# Patient Record
Sex: Female | Born: 1955 | Race: White | Hispanic: No | Smoking: Former smoker
Health system: Southern US, Community
[De-identification: ages and names within clinical notes are randomized; demographics above are authoritative.]

## PROBLEM LIST (undated history)

## (undated) DIAGNOSIS — K2210 Ulcer of esophagus without bleeding: Secondary | ICD-10-CM

## (undated) DIAGNOSIS — M1990 Unspecified osteoarthritis, unspecified site: Secondary | ICD-10-CM

## (undated) DIAGNOSIS — E785 Hyperlipidemia, unspecified: Secondary | ICD-10-CM

## (undated) DIAGNOSIS — J189 Pneumonia, unspecified organism: Secondary | ICD-10-CM

## (undated) DIAGNOSIS — E119 Type 2 diabetes mellitus without complications: Secondary | ICD-10-CM

## (undated) DIAGNOSIS — E1169 Type 2 diabetes mellitus with other specified complication: Secondary | ICD-10-CM

## (undated) DIAGNOSIS — I10 Essential (primary) hypertension: Secondary | ICD-10-CM

## (undated) DIAGNOSIS — F329 Major depressive disorder, single episode, unspecified: Secondary | ICD-10-CM

## (undated) DIAGNOSIS — E669 Obesity, unspecified: Secondary | ICD-10-CM

## (undated) DIAGNOSIS — K5710 Diverticulosis of small intestine without perforation or abscess without bleeding: Secondary | ICD-10-CM

## (undated) DIAGNOSIS — L309 Dermatitis, unspecified: Secondary | ICD-10-CM

## (undated) DIAGNOSIS — G894 Chronic pain syndrome: Secondary | ICD-10-CM

## (undated) DIAGNOSIS — Z8709 Personal history of other diseases of the respiratory system: Secondary | ICD-10-CM

## (undated) HISTORY — PX: COLONOSCOPY: SHX174

## (undated) HISTORY — DX: Major depressive disorder, single episode, unspecified: F32.9

## (undated) HISTORY — DX: Unspecified osteoarthritis, unspecified site: M19.90

## (undated) HISTORY — DX: Type 2 diabetes mellitus without complications: E11.9

## (undated) HISTORY — DX: Obesity, unspecified: E66.9

## (undated) HISTORY — DX: Type 2 diabetes mellitus with other specified complication: E11.69

## (undated) HISTORY — DX: Chronic pain syndrome: G89.4

## (undated) HISTORY — PX: UPPER GI ENDOSCOPY: SHX6162

---

## 2005-04-22 ENCOUNTER — Emergency Department (HOSPITAL_COMMUNITY): Admission: EM | Admit: 2005-04-22 | Discharge: 2005-04-22 | Payer: Self-pay | Admitting: Emergency Medicine

## 2012-03-31 ENCOUNTER — Emergency Department (HOSPITAL_COMMUNITY): Payer: Self-pay

## 2012-03-31 ENCOUNTER — Emergency Department (HOSPITAL_COMMUNITY)
Admission: EM | Admit: 2012-03-31 | Discharge: 2012-04-01 | Disposition: A | Discharge status: Home / Self Care | Payer: Self-pay | Attending: Emergency Medicine | Admitting: Emergency Medicine

## 2012-03-31 ENCOUNTER — Encounter (HOSPITAL_COMMUNITY): Payer: Self-pay | Admitting: Emergency Medicine

## 2012-03-31 DIAGNOSIS — S91009A Unspecified open wound, unspecified ankle, initial encounter: Secondary | ICD-10-CM | POA: Insufficient documentation

## 2012-03-31 DIAGNOSIS — W268XXA Contact with other sharp object(s), not elsewhere classified, initial encounter: Secondary | ICD-10-CM | POA: Insufficient documentation

## 2012-03-31 DIAGNOSIS — W01119A Fall on same level from slipping, tripping and stumbling with subsequent striking against unspecified sharp object, initial encounter: Secondary | ICD-10-CM | POA: Insufficient documentation

## 2012-03-31 DIAGNOSIS — S81009A Unspecified open wound, unspecified knee, initial encounter: Secondary | ICD-10-CM | POA: Insufficient documentation

## 2012-03-31 DIAGNOSIS — F17200 Nicotine dependence, unspecified, uncomplicated: Secondary | ICD-10-CM | POA: Insufficient documentation

## 2012-03-31 DIAGNOSIS — M79609 Pain in unspecified limb: Secondary | ICD-10-CM | POA: Insufficient documentation

## 2012-03-31 DIAGNOSIS — Z79899 Other long term (current) drug therapy: Secondary | ICD-10-CM | POA: Insufficient documentation

## 2012-03-31 DIAGNOSIS — S81811A Laceration without foreign body, right lower leg, initial encounter: Secondary | ICD-10-CM

## 2012-03-31 HISTORY — DX: Essential (primary) hypertension: I10

## 2012-03-31 HISTORY — DX: Hyperlipidemia, unspecified: E78.5

## 2012-03-31 MED ORDER — HYDROMORPHONE HCL PF 1 MG/ML IJ SOLN
1.0000 mg | Freq: Once | INTRAMUSCULAR | Status: AC
  Administered 2012-03-31: 1 mg via INTRAVENOUS
  Filled 2012-03-31: qty 1

## 2012-03-31 MED ORDER — LIDOCAINE HCL (PF) 1 % IJ SOLN
5.0000 mL | Freq: Once | INTRAMUSCULAR | Status: DC
  Filled 2012-03-31: qty 10

## 2012-03-31 MED ORDER — TETANUS-DIPHTH-ACELL PERTUSSIS 5-2.5-18.5 LF-MCG/0.5 IM SUSP
0.5000 mL | Freq: Once | INTRAMUSCULAR | Status: AC
  Administered 2012-03-31: 0.5 mL via INTRAMUSCULAR
  Filled 2012-03-31: qty 0.5

## 2012-03-31 NOTE — ED Notes (Signed)
Pt cleaned up by EMT, had incont of stool.

## 2012-03-31 NOTE — ED Provider Notes (Addendum)
History     CSN: [id number]  Arrival date & time 03/31/12  2237   First MD Initiated Contact with Patient 03/31/12 2259      Chief Complaint  Patient presents with  . Extremity Laceration    RLE large lac fell into glass table    (Consider location/radiation/quality/duration/timing/severity/associated sxs/prior treatment) HPI Comments: 56 year old female presents by ambulance after falling into a glass coffee table and sustaining a large laceration to the posterior right lower extremity below the knee. There was an excessive amount of bleeding, acute in onset, constant, worse with palpation, not associated with numbness or tingling of the foot. She's been unable to walk because of severe pain in this area. He denies any other injuries, she is not up-to-date on tetanus  The history is provided by the patient and the EMS personnel.    Past Medical History  Diagnosis Date  . Hypertension   . Hyperlipemia     History reviewed. No pertinent past surgical history.  History reviewed. No pertinent family history.  History  Substance Use Topics  . Smoking status: Current Some Day Smoker  . Smokeless tobacco: Not on file  . Alcohol Use: Yes    OB History    Grav Para Term Preterm Abortions TAB SAB Ect Mult Living                  Review of Systems  Constitutional: Negative for fever.  Gastrointestinal: Negative for vomiting.  Skin: Positive for wound.       Laceration  Neurological: Negative for weakness and numbness.    Allergies  Penicillins  Home Medications   Current Outpatient Rx  Name Route Sig Dispense Refill  . AMITRIPTYLINE HCL PO Oral Take by mouth.    . DOXEPIN HCL PO Oral Take 1 tablet by mouth daily.    Marland Kitchen HYDROCODONE-ACETAMINOPHEN 5-500 MG PO TABS Oral Take 1-2 tablets by mouth every 6 (six) hours as needed for pain. 15 tablet 0  . NAPROXEN 500 MG PO TABS Oral Take 1 tablet (500 mg total) by mouth 2 (two) times daily with a meal. 30 tablet 0  .  PRAVASTATIN SODIUM 20 MG PO TABS Oral Take 20 mg by mouth daily.    Marland Kitchen PROPRANOLOL HCL 20 MG PO TABS Oral Take 20 mg by mouth daily.    . AMBIEN PO Oral Take by mouth.      BP 149/85  Pulse 99  Temp(Src) 98.2 F (36.8 C) (Oral)  Resp 16  SpO2 97%  Physical Exam  Constitutional: She appears well-developed and well-nourished. No distress.  HENT:  Head: Normocephalic.  Eyes: Conjunctivae are normal. No scleral icterus.  Cardiovascular: Normal rate and regular rhythm.        Normal pulses at the dorsalis pedis and posterior tibial of the right foot, normal sensation to light touch and pinprick of the right foot distal to the injury  Pulmonary/Chest: Effort normal and breath sounds normal.  Abdominal: Soft. Bowel sounds are normal. She exhibits no distension. There is no tenderness.  Musculoskeletal: Normal range of motion. She exhibits tenderness ( ttp over the laceration site). She exhibits no edema.  Neurological: She is alert. Coordination normal.       Sensation and motor intact  Skin: Skin is warm and dry. She is not diaphoretic.       Laceration located on right lower extremity posterior medial below the knee The Laceration is linear shaped The depth is muscle The length is 13cm  ED Course  Procedures (including critical care time)  Labs Reviewed - No data to display Dg Tibia/fibula Right  03/31/2012  *RADIOLOGY REPORT*  Clinical Data: Multiple lacerations from glass table injury.  RIGHT TIBIA AND FIBULA - 2 VIEW  Comparison: None.  Findings: There is no visible fracture or dislocation.  No radiopaque foreign bodies are seen in the soft tissues, which appear swollen.  IMPRESSION: No acute osseous findings.  Original Report Authenticated By: Elsie Stain, M.D.     1. Laceration of right lower extremity       MDM  X-rays reveal no signs of foreign body, wound has been evaluated and no obvious foreign body seen on initial inspection, updated tetanus, hydromorphone IV,  local anesthetic and primary repair after extensive irrigation.  LACERATION REPAIR Performed by: Vida Roller Authorized by: Vida Roller Consent: Verbal consent obtained. Risks and benefits: risks, benefits and alternatives were discussed Consent given by: patient Patient identity confirmed: provided demographic data Prepped and Draped in normal sterile fashion Wound explored  Laceration Location: R calf  Laceration Length: 13cm  No Foreign Bodies seen or palpated  Anesthesia: local infiltration  Local anesthetic: lidocaine 1% with epinephrine  Anesthetic total: 15 ml  Irrigation method: syringe - 2 L of NS Amount of cleaning: extensive - blood clot debrided.  No FB seen  Skin closure: staples  Number of sutures: 17  Technique: staples  Patient tolerance: Patient tolerated the procedure well with no immediate complications.   1:20 AM, patient reevaluated, wound rechecked and found to have some bleeding. This appears to be from the hematoma that is collected in the flap-like laceration. The wound edges are intact, the pulses and sensation are normal to the distal leg and foot on the right. I have re-done and re-wrapped the patient's lower extremity with quick clot and a pressure dressing  3:00 AM, the bleeding has totally stopped, she has normal pulses and sensation at her foot after one half hours with a dressing on the leg. She has been instructed on proper wound care and will followup with her family doctor at an established appointment within 24 hours. For a wound check. She is expressed her understanding for the indications for return,  Discharge Prescriptions include:  Naprosyn  Hydrocodone  Vida Roller, MD 04/01/12 2130  Vida Roller, MD 04/01/12 [phone]

## 2012-03-31 NOTE — ED Notes (Signed)
Pt fell into glass coffee table causing large laceration to Right calve bleeding difficult to controlled strike threw bleed to large bulk dressing noted

## 2012-04-01 MED ORDER — NAPROXEN 500 MG PO TABS: 500.0000 mg | ORAL_TABLET | Freq: Two times a day (BID) | ORAL | Status: AC

## 2012-04-01 MED ORDER — HYDROCODONE-ACETAMINOPHEN 5-500 MG PO TABS: 1.0000 | ORAL_TABLET | Freq: Four times a day (QID) | ORAL | Status: AC | PRN

## 2012-04-01 NOTE — ED Notes (Signed)
Wound redressed with pressure dressing 

## 2012-04-01 NOTE — Discharge Instructions (Signed)
You have 17 staples in her leg, these need to come out in 10 days, have your Dr. reevaluate your wound in one to 2 days. Keep the wound clean and dry, place a topical antibiotic cream on it each time you changed the dressing.

## 2013-12-14 ENCOUNTER — Other Ambulatory Visit (HOSPITAL_COMMUNITY): Payer: Self-pay | Admitting: Family Medicine

## 2013-12-14 DIAGNOSIS — Z1231 Encounter for screening mammogram for malignant neoplasm of breast: Secondary | ICD-10-CM

## 2013-12-17 ENCOUNTER — Ambulatory Visit (HOSPITAL_COMMUNITY)
Admission: RE | Admit: 2013-12-17 | Discharge: 2013-12-17 | Disposition: A | Discharge status: Home / Self Care | Payer: Self-pay | Source: Ambulatory Visit | Attending: Family Medicine | Admitting: Family Medicine

## 2013-12-17 DIAGNOSIS — Z1231 Encounter for screening mammogram for malignant neoplasm of breast: Secondary | ICD-10-CM

## 2015-03-28 ENCOUNTER — Other Ambulatory Visit (HOSPITAL_COMMUNITY): Payer: Self-pay | Admitting: Orthopaedic Surgery

## 2015-03-28 DIAGNOSIS — M545 Low back pain: Secondary | ICD-10-CM

## 2015-04-06 DIAGNOSIS — J189 Pneumonia, unspecified organism: Secondary | ICD-10-CM

## 2015-04-06 HISTORY — DX: Pneumonia, unspecified organism: J18.9

## 2015-04-06 HISTORY — PX: STOMAPLASTY: SHX5219

## 2015-04-11 ENCOUNTER — Ambulatory Visit (HOSPITAL_COMMUNITY): Payer: Self-pay

## 2015-04-12 ENCOUNTER — Other Ambulatory Visit: Payer: Self-pay | Admitting: Sports Medicine

## 2015-04-25 HISTORY — PX: COLOSTOMY: SHX63

## 2015-08-31 ENCOUNTER — Encounter: Payer: Self-pay | Admitting: Physical Medicine & Rehabilitation

## 2015-09-01 ENCOUNTER — Encounter: Payer: Self-pay | Attending: Physical Medicine & Rehabilitation | Admitting: Physical Medicine & Rehabilitation

## 2015-10-19 ENCOUNTER — Other Ambulatory Visit: Payer: Self-pay | Admitting: Physical Medicine & Rehabilitation

## 2015-10-19 ENCOUNTER — Encounter: Payer: Self-pay | Attending: Physical Medicine & Rehabilitation | Admitting: Physical Medicine & Rehabilitation

## 2015-10-19 ENCOUNTER — Encounter: Payer: Self-pay | Admitting: Physical Medicine & Rehabilitation

## 2015-10-19 VITALS — BP 127/80 | HR 91

## 2015-10-19 DIAGNOSIS — M2040 Other hammer toe(s) (acquired), unspecified foot: Secondary | ICD-10-CM | POA: Insufficient documentation

## 2015-10-19 DIAGNOSIS — G894 Chronic pain syndrome: Secondary | ICD-10-CM | POA: Insufficient documentation

## 2015-10-19 DIAGNOSIS — F32A Depression, unspecified: Secondary | ICD-10-CM

## 2015-10-19 DIAGNOSIS — Z79899 Other long term (current) drug therapy: Secondary | ICD-10-CM | POA: Insufficient documentation

## 2015-10-19 DIAGNOSIS — E119 Type 2 diabetes mellitus without complications: Secondary | ICD-10-CM | POA: Insufficient documentation

## 2015-10-19 DIAGNOSIS — M2041 Other hammer toe(s) (acquired), right foot: Secondary | ICD-10-CM | POA: Insufficient documentation

## 2015-10-19 DIAGNOSIS — M2042 Other hammer toe(s) (acquired), left foot: Secondary | ICD-10-CM | POA: Insufficient documentation

## 2015-10-19 DIAGNOSIS — M1611 Unilateral primary osteoarthritis, right hip: Secondary | ICD-10-CM | POA: Insufficient documentation

## 2015-10-19 DIAGNOSIS — F329 Major depressive disorder, single episode, unspecified: Secondary | ICD-10-CM | POA: Insufficient documentation

## 2015-10-19 DIAGNOSIS — E669 Obesity, unspecified: Secondary | ICD-10-CM | POA: Insufficient documentation

## 2015-10-19 DIAGNOSIS — M1990 Unspecified osteoarthritis, unspecified site: Secondary | ICD-10-CM

## 2015-10-19 DIAGNOSIS — E1169 Type 2 diabetes mellitus with other specified complication: Secondary | ICD-10-CM | POA: Insufficient documentation

## 2015-10-19 DIAGNOSIS — Z5181 Encounter for therapeutic drug level monitoring: Secondary | ICD-10-CM | POA: Insufficient documentation

## 2015-10-19 HISTORY — DX: Chronic pain syndrome: G89.4

## 2015-10-19 HISTORY — DX: Unspecified osteoarthritis, unspecified site: M19.90

## 2015-10-19 HISTORY — DX: Type 2 diabetes mellitus with other specified complication: E11.69

## 2015-10-19 HISTORY — DX: Depression, unspecified: F32.A

## 2015-10-19 MED ORDER — HYDROCODONE-ACETAMINOPHEN 5-325 MG PO TABS: 1.0000 | ORAL_TABLET | Freq: Three times a day (TID) | ORAL | Status: DC | PRN

## 2015-10-19 NOTE — Addendum Note (Signed)
Addended by: Doreene ElandSHUMAKER, Windell Musson W on: 10/19/2015 01:46 PM   Modules accepted: Orders

## 2015-10-19 NOTE — Progress Notes (Signed)
Subjective:    Patient ID: Tamara York, female    DOB: 06/30/1956, 59 y.o.   MRN: [id number]  HPI 59 y/o female with pmh of DM and depression presents with right hip pain.  This has been present since 2014 and getting progressively worse.  She denies inciting events.  Any prolonged posture exacerbates the pain.  She went to a chiropractor x3 , but did not have any relief.  Hydrocodone and goody powders improve the pain.   Describes the pain as sharp and stabbing.  Radiates to her right ankle.  She is unable to bend down to reach her foot because it reproduces the pain.  Today, severity is 9/10.  Pain is constant.  The pain is limiting her ability to complete ADLs.  She ambulates with a rolling walker at all times.    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  She received hyaluronic acid injection, which improved her function, but not pain.  She has an appointment to see them later this month.    Back extension improves the pain vs. Flexion.    Pain Inventory Average Pain 10 Pain Right Now 9 My pain is constant, sharp, stabbing, tingling and aching  In the last 24 hours, has pain interfered with the following? General activity 10 Relation with others 0 Enjoyment of life 10 What TIME of day is your pain at its worst? morning Sleep (in general) Good  Pain is worse with: walking, bending, sitting, standing and some activites Pain improves with: medication Relief from Meds: 5  Mobility walk with assistance use a walker how many minutes can you walk? 3 ability to climb steps?  no do you drive?  yes Do you have any goals in this area?  yes  Function not employed: date last employed 2013 I need assistance with the following:  dressing and household duties  Neuro/Psych trouble walking  Prior Studies Any changes since last visit?  no  Physicians involved  in your care Any changes since last visit?  no   Family History  Problem Relation Age of Onset  . Heart failure Mother   . Heart disease Father    Social History   Social History  . Marital Status: Widowed    Spouse Name: N/A  . Number of Children: N/A  . Years of Education: N/A   Social History Main Topics  . Smoking status: Current Every Day Smoker -- 0.50 packs/day    Types: Cigarettes  . Smokeless tobacco: None  . Alcohol Use: No  . Drug Use: No  . Sexual Activity: Not Asked   Other Topics Concern  . None   Social History Narrative   Past Surgical History  Procedure Laterality Date  . Cesarean section  1983, 1987  . Stomaplasty  04/2015   Past Medical History  Diagnosis Date  . Hypertension   . Hyperlipemia   . Depression   . Diabetes (HCC)    BP 127/80 mmHg  Pulse 91  SpO2 95%  Opioid Risk Score:   Fall Risk Score:  `1  Depression screen PHQ 2/9  Depression screen PHQ 2/9 10/19/2015  Decreased Interest 0  Down, Depressed, Hopeless 2  PHQ - 2 Score 2  Altered sleeping 0  Tired, decreased energy 0  Change in appetite 0  Feeling bad or failure about yourself  0  Trouble concentrating 0  Moving slowly or fidgety/restless 0  Suicidal thoughts 0  PHQ-9 Score 2  Difficult doing work/chores Somewhat difficult    Current outpatient prescriptions:  .  AMITRIPTYLINE HCL PO, Take by mouth., Disp: , Rfl:  .  Aspirin-Acetaminophen (GOODYS BODY PAIN PO), Take by mouth., Disp: , Rfl:  .  buPROPion (WELLBUTRIN XL) 150 MG 24 hr tablet, Take 150 mg by mouth daily., Disp: , Rfl:  .  gabapentin (NEURONTIN) 600 MG tablet, Take 600 mg by mouth 3 (three) times daily., Disp: , Rfl:  .  metFORMIN (GLUCOPHAGE-XR) 500 MG 24 hr tablet, Take 500 mg by mouth daily with breakfast., Disp: , Rfl:  .  pravastatin (PRAVACHOL) 20 MG tablet, Take 20 mg by mouth daily., Disp: , Rfl:  .  triamterene-hydrochlorothiazide (MAXZIDE-25) 37.5-25 MG tablet, Take 1 tablet by mouth 2  (two) times daily., Disp: , Rfl:  .  Zolpidem Tartrate (AMBIEN PO), Take by mouth., Disp: , Rfl:    Review of Systems  Endocrine:       High Blood Sugar  Musculoskeletal: Positive for gait problem.  Skin:       Eczema   All other systems reviewed and are negative.      Objective:   Physical Exam HENT: Normocephalic, Atraumatic Eyes: EOMI, Conj WNL Cardio: S1, S2 normal, RRR.  Venous stasis changes b/l feet.  Pulm: B/l clear to auscultation.  Effort normal Abd: Soft, non-distended, non-tender, BS+ MSK:  Antalgic gait.   TTP right hip.   +FABER for right hip pain, +FADER right hip +Hammer toes b/l LE No edema. Neuro: CN II-XII grossly intact.    Sensation intact to light touch diminished b/l feet.  Strength      3+/5 RLE due to pain inhibiition    5/5 in all UE myotomes    5/5 in all LLE myotomes Skin: Venous stasis changes b/l feet.       Assessment & Plan:  59 y/o female with pmh of DM and depression presents with right hip pain.    1. 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      Steroid injection is not an option at present because the pt is concurrently receiving hyaluronic acid injections  Encouraged pt to follow up with abd surgeon regarding restrictions for pool therapy  Referral to PT for evaluation, strengthening, TENS, and education for accommodative techniques to perform ADLs until pt's medicaid application is processed and she can afford a hip replacement.   Cont ice packs  Will prescribe Norco 5-325 TID PRN for pain and reevaluate while awaiting surgery  Will consider podiatry referral in future  Information provided for financial assistance programs  Will obtain UDS  Will consider pain contract on next visit  2. Gait abnormality  Cont rolling walker for safety and pain relief.

## 2015-10-20 LAB — PMP ALCOHOL METABOLITE (ETG)

## 2015-10-25 LAB — OPIATES/OPIOIDS (LC/MS-MS)
Codeine Urine: NEGATIVE ng/mL (ref <50)
HYDROCODONE: 528 ng/mL (ref <50)
HYDROMORPHONE: 66 ng/mL (ref <50)
MORPHINE: NEGATIVE ng/mL (ref <50)
NORHYDROCODONE, UR: 473 ng/mL (ref <50)
NOROXYCODONE, UR: 765 ng/mL — AB (ref <50)
Oxycodone, ur: 205 ng/mL — AB (ref <50)
Oxymorphone: 311 ng/mL — AB (ref <50)

## 2015-10-25 LAB — ZOLPIDEM (LC/MS-MS), URINE
ZOLPIDEM METABOLITE (GC/LS/MS) UR, CONFIRM: 317 ng/mL — AB (ref <5)
Zolpidem (GC/LC/MS), Ur confirm: 8 ng/mL — AB (ref <5)

## 2015-10-25 LAB — OXYCODONE, URINE (LC/MS-MS)
Noroxycodone, Ur: 765 ng/mL — AB (ref <50)
OXYCODONE, UR: 205 ng/mL — AB (ref <50)
Oxymorphone: 311 ng/mL — AB (ref <50)

## 2015-10-25 LAB — ETHYL GLUCURONIDE, URINE
ETGU: 746 ng/mL — AB (ref <500)
Ethyl Sulfate (ETS): 218 ng/mL — ABNORMAL HIGH (ref <100)

## 2015-10-26 LAB — PRESCRIPTION MONITORING PROFILE (SOLSTAS)
AMPHETAMINE/METH: NEGATIVE ng/mL
Barbiturate Screen, Urine: NEGATIVE ng/mL
Benzodiazepine Screen, Urine: NEGATIVE ng/mL
Buprenorphine, Urine: NEGATIVE ng/mL
CANNABINOID SCRN UR: NEGATIVE ng/mL
CARISOPRODOL, URINE: NEGATIVE ng/mL
CREATININE, URINE: 36.65 mg/dL (ref >20.0)
Cocaine Metabolites: NEGATIVE ng/mL
ECSTASY: NEGATIVE ng/mL
Fentanyl, Ur: NEGATIVE ng/mL
METHADONE SCREEN, URINE: NEGATIVE ng/mL
Meperidine, Ur: NEGATIVE ng/mL
NITRITES URINE, INITIAL: NEGATIVE ug/mL
PH URINE, INITIAL: 6.1 pH (ref 4.5–8.9)
Propoxyphene: NEGATIVE ng/mL
TRAMADOL UR: NEGATIVE ng/mL
Tapentadol, urine: NEGATIVE ng/mL

## 2015-11-08 NOTE — Progress Notes (Addendum)
Urine drug screen for this encounter is consistent for prescribed medication, but this drug screen was also positive for alcohol as well as oxycodone therefore is inconsistent because it was not reported

## 2015-11-16 ENCOUNTER — Encounter: Payer: Medicaid Other | Attending: Physical Medicine & Rehabilitation | Admitting: Physical Medicine & Rehabilitation

## 2015-11-16 ENCOUNTER — Encounter: Payer: Self-pay | Admitting: Physical Medicine & Rehabilitation

## 2015-11-16 VITALS — BP 144/67 | HR 92 | Resp 16

## 2015-11-16 DIAGNOSIS — Z5181 Encounter for therapeutic drug level monitoring: Secondary | ICD-10-CM | POA: Diagnosis present

## 2015-11-16 DIAGNOSIS — E669 Obesity, unspecified: Secondary | ICD-10-CM | POA: Diagnosis present

## 2015-11-16 DIAGNOSIS — F329 Major depressive disorder, single episode, unspecified: Secondary | ICD-10-CM | POA: Diagnosis present

## 2015-11-16 DIAGNOSIS — M2042 Other hammer toe(s) (acquired), left foot: Secondary | ICD-10-CM | POA: Insufficient documentation

## 2015-11-16 DIAGNOSIS — Z79899 Other long term (current) drug therapy: Secondary | ICD-10-CM | POA: Insufficient documentation

## 2015-11-16 DIAGNOSIS — M1611 Unilateral primary osteoarthritis, right hip: Secondary | ICD-10-CM | POA: Insufficient documentation

## 2015-11-16 DIAGNOSIS — M2041 Other hammer toe(s) (acquired), right foot: Secondary | ICD-10-CM | POA: Diagnosis present

## 2015-11-16 DIAGNOSIS — G894 Chronic pain syndrome: Secondary | ICD-10-CM | POA: Diagnosis present

## 2015-11-16 DIAGNOSIS — M2022 Hallux rigidus, left foot: Secondary | ICD-10-CM

## 2015-11-16 DIAGNOSIS — E119 Type 2 diabetes mellitus without complications: Secondary | ICD-10-CM | POA: Insufficient documentation

## 2015-11-16 DIAGNOSIS — R269 Unspecified abnormalities of gait and mobility: Secondary | ICD-10-CM

## 2015-11-16 DIAGNOSIS — G4700 Insomnia, unspecified: Secondary | ICD-10-CM

## 2015-11-16 MED ORDER — TRAMADOL HCL 50 MG PO TABS: 100.0000 mg | ORAL_TABLET | Freq: Four times a day (QID) | ORAL | Status: DC | PRN

## 2015-11-16 NOTE — Progress Notes (Addendum)
Subjective:    Patient ID: Tamara York, female    DOB: [birth year], 60 y.o.   MRN: [id number]  HPI  60 y/o female with pmh of DM and depression presents for follow up of right hip pain.  Pt was last seen 10/19/15.  This has been present since 2014 and getting progressively worse.  She denies inciting events.  Any prolonged posture exacerbates the pain.  She went to a chiropractor x3 , but did not have any relief.  Hydrocodone and goody powders improve the pain.   Describes the pain as sharp and stabbing.  Radiates to her right ankle.  She is unable to bend down to reach her foot because it reproduces the pain.  Today, severity is 10/10.  Pain is constant. The pain is limiting her ability to complete ADLs.  She ambulates with a rolling walker at all times.  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  She received hyaluronic acid injection, which improved her function, but not pain.    At last visit, she was encouraged to follow up with her surgeon regarding restriction for pool therapy.  A referral was made to [facility], she was given information regarding financial assistance programs and a UDS was performed.  UDS returned inconsistent.    Since last visit, she had another hyaluronic acid injection this morning.  She feels better afterward. She did not follow up with her surgeon regarding pool therapy.   She states that physical therapy told her because she has pain with ambulation it is better to wait until after the surgery to have PT.  She has not filled out information for financial assistance programs yet.    Pain Inventory Average Pain 10 Pain Right Now 9 My pain is constant, sharp, burning, stabbing, tingling and aching  In the last 24 hours, has pain interfered with the following? General activity 9 Relation with others 9 Enjoyment of life 10 What TIME of day is your pain at its  worst? morning, night Sleep (in general) Fair  Pain is worse with: walking, bending, sitting, standing and some activites Pain improves with: heat/ice and medication Relief from Meds: 8  Mobility walk with assistance use a walker how many minutes can you walk? 5 ability to climb steps?  no do you drive?  yes  Function not employed: date last employed NA I need assistance with the following:  bathing and household duties Do you have any goals in this area?  yes  Neuro/Psych numbness tingling trouble walking spasms depression  Prior Studies Any changes since last visit?  no x-rays  Physicians involved in your care Primary care . Orthopedist .   Family History  Problem Relation Age of Onset  . Heart failure Mother   . Heart disease Father    Social History   Social History  . Marital Status: Widowed    Spouse Name: N/A  . Number of Children: N/A  . Years of Education: N/A   Social History Main Topics  . Smoking status: Current Every Day Smoker -- 0.50 packs/day    Types: Cigarettes  . Smokeless tobacco: None  . Alcohol Use: No  . Drug Use: No  . Sexual Activity: Not Asked   Other Topics Concern  . None   Social History Narrative   Past Surgical History  Procedure Laterality Date  . Cesarean section  1983, 1987  . Stomaplasty  04/2015   Past Medical History  Diagnosis Date  . Hypertension   . Hyperlipemia   . Depression   . Diabetes (HCC)   . OA (osteoarthritis) 10/19/2015  . Depression 10/19/2015  . Diabetes mellitus type 2 in obese (HCC) 10/19/2015  . Chronic pain syndrome 10/19/2015   BP 144/67 mmHg  Pulse 92  Resp 16  SpO2 96%  Opioid Risk Score:   Fall Risk Score:  `1  Depression screen PHQ 2/9  Depression screen PHQ 2/9 10/19/2015  Decreased Interest 0  Down, Depressed, Hopeless 2  PHQ - 2 Score 2  Altered sleeping 0  Tired, decreased energy 0  Change in appetite 0  Feeling bad or failure about yourself  0  Trouble  concentrating 0  Moving slowly or fidgety/restless 0  Suicidal thoughts 0  PHQ-9 Score 2  Difficult doing work/chores Somewhat difficult   Current Outpatient Prescriptions on File Prior to Visit  Medication Sig Dispense Refill  . AMITRIPTYLINE HCL PO Take by mouth.    . Aspirin-Acetaminophen (GOODYS BODY PAIN PO) Take by mouth.    Marland Kitchen buPROPion (WELLBUTRIN XL) 150 MG 24 hr tablet Take 150 mg by mouth daily.    Marland Kitchen gabapentin (NEURONTIN) 600 MG tablet Take 600 mg by mouth 3 (three) times daily.    Marland Kitchen HYDROcodone-acetaminophen (NORCO) 5-325 MG tablet Take 1 tablet by mouth every 8 (eight) hours as needed for moderate pain. 90 tablet 0  . metFORMIN (GLUCOPHAGE-XR) 500 MG 24 hr tablet Take 500 mg by mouth daily with breakfast.    . pravastatin (PRAVACHOL) 20 MG tablet Take 20 mg by mouth daily.    Marland Kitchen triamterene-hydrochlorothiazide (MAXZIDE-25) 37.5-25 MG tablet Take 1 tablet by mouth 2 (two) times daily.    . Zolpidem Tartrate (AMBIEN PO) Take by mouth.     No current facility-administered medications on file prior to visit.   Review of Systems  Endocrine:       High blood sugar  Musculoskeletal: Positive for gait problem.  Skin: Positive for rash.  Neurological: Positive for numbness.       Tingling spasms  Psychiatric/Behavioral: Positive for dysphoric mood.  All other systems reviewed and are negative.     Objective:   Physical Exam HENT: Normocephalic, Atraumatic Eyes: EOMI, Conj WNL Cardio: S1, S2 normal, RRR. Venous stasis changes b/l feet.  Pulm: B/l clear to auscultation. Effort normal Abd: Soft, non-distended, non-tender, BS+ MSK:  Antalgic gait.  TTP right hip.  +FABER for right hip pain, +FADER right hip +Hammer toes b/l LE No edema. Neuro: CN II-XII grossly intact.  Sensation intact to light touch diminished b/l feet.  Motor  RLE 3+/5 hip flexion, 4/5 knee extension, ankle dorsi/plantar flexion (all limited by pain)   LLE  4+/5 proximal to distal (pain inhibition) Skin: Venous stasis changes b/l feet.     Assessment & Plan:  60 y/o female with pmh of DM and depression presents with right hip pain.    1. 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                  Steroid injection is not an option at present because the pt is concurrently receiving hyaluronic acid injections  Encouraged pt to follow up with abd surgeon regarding restrictions for pool therapy again  Will not refill Norco as pt's UDS was inconsistent showing not only hydrocodone, but oxycodone and Etoh as well.  Pt is in the processing of completing financial assistance paperwork.  Medicaid is still pending             Pt states PT said it would be better for pt to wait until surgery prior to evaluation.  Will refer to PT again for management of pain prior to surgery              Cont ice packs  Tramadol 100mg  q6 PRN prescribed  2. Gait abnormality             Cont rolling walker for safety and pain relief.     3. Hammer toes  Referral to Orthotist deferred by pt at this time due to financial reasons  Referral made to podiatrist  4. Insomnia  Will d/c Amytriptaline due to interaction with tramadol.  Pt is currently on Ambien, which she can continue taking

## 2016-01-04 ENCOUNTER — Encounter: Payer: Medicaid Other | Admitting: Physical Medicine & Rehabilitation

## 2016-01-12 ENCOUNTER — Encounter: Payer: Self-pay | Admitting: Physical Medicine & Rehabilitation

## 2016-01-12 ENCOUNTER — Encounter: Payer: Medicaid Other | Attending: Physical Medicine & Rehabilitation | Admitting: Physical Medicine & Rehabilitation

## 2016-01-12 VITALS — BP 149/69 | HR 101

## 2016-01-12 DIAGNOSIS — Z79899 Other long term (current) drug therapy: Secondary | ICD-10-CM | POA: Insufficient documentation

## 2016-01-12 DIAGNOSIS — M2041 Other hammer toe(s) (acquired), right foot: Secondary | ICD-10-CM | POA: Diagnosis present

## 2016-01-12 DIAGNOSIS — E669 Obesity, unspecified: Secondary | ICD-10-CM | POA: Diagnosis present

## 2016-01-12 DIAGNOSIS — Z5181 Encounter for therapeutic drug level monitoring: Secondary | ICD-10-CM | POA: Diagnosis present

## 2016-01-12 DIAGNOSIS — R269 Unspecified abnormalities of gait and mobility: Secondary | ICD-10-CM

## 2016-01-12 DIAGNOSIS — F329 Major depressive disorder, single episode, unspecified: Secondary | ICD-10-CM | POA: Diagnosis present

## 2016-01-12 DIAGNOSIS — M1611 Unilateral primary osteoarthritis, right hip: Secondary | ICD-10-CM | POA: Insufficient documentation

## 2016-01-12 DIAGNOSIS — M2042 Other hammer toe(s) (acquired), left foot: Secondary | ICD-10-CM | POA: Diagnosis present

## 2016-01-12 DIAGNOSIS — E119 Type 2 diabetes mellitus without complications: Secondary | ICD-10-CM | POA: Insufficient documentation

## 2016-01-12 DIAGNOSIS — G894 Chronic pain syndrome: Secondary | ICD-10-CM | POA: Diagnosis present

## 2016-01-12 MED ORDER — TRAMADOL HCL 50 MG PO TABS: 100.0000 mg | ORAL_TABLET | Freq: Four times a day (QID) | ORAL | Status: DC | PRN

## 2016-01-12 NOTE — Progress Notes (Signed)
Subjective:    Patient ID: MERY GUADALUPE, female    DOB: July 03, 1956, 60 y.o.   MRN: [id number]  HPI 60 y/o female with pmh of DM and depression presents for follow up of right hip pain. Pt was last seen 11/15/14. This has been present since 2014 and getting progressively worse. She denies inciting events. Any prolonged posture exacerbates the pain. She went to a chiropractor x3 , but did not have any relief. Hydrocodone and goody powders improve the pain. Describes the pain as sharp and stabbing. Radiates to her right ankle. She is unable to bend down to reach her foot because it reproduces the pain. Today, severity is 10/10. Pain is constant. The pain is limiting her ability to complete ADLs. She ambulates with a rolling walker at all times. 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 She received hyaluronic acid injection, which improved her function, but not pain.   At last visit, she had just received hyaluronic acid injection, which improves her function.  She spoke to her surgeon who stated she could go in the pool, but they do not recommend it.  She is still working on filling out paperwork for financial assistance. She states she should hear soon regarding Medicaid.  Tramadol makes her itch.  She is not taking Gabapentin because it does not help (prescribed by PCP).  She did not see PT.  Pt did not see Podiatrist.  She continues to take Ambien.    Pain Inventory Average Pain 10 Pain Right Now 10 My pain is sharp, burning, stabbing, tingling and aching  In the last 24 hours, has pain interfered with the following? General activity 10 Relation with others 9 Enjoyment of life 10 What TIME of day is your pain at its worst? morning and evening Sleep (in general) Fair  Pain is worse with: walking, bending, sitting, inactivity, standing and some activites Pain  improves with: rest, heat/ice, medication and injections Relief from Meds: 9  Mobility use a walker how many minutes can you walk? 3 ability to climb steps?  no do you drive?  yes  Function not employed: date last employed 2013  Neuro/Psych numbness tingling trouble walking anxiety  Prior Studies Any changes since last visit?  no  Physicians involved in your care Any changes since last visit?  no   Family History  Problem Relation Age of Onset  . Heart failure Mother   . Heart disease Father    Social History   Social History  . Marital Status: Widowed    Spouse Name: N/A  . Number of Children: N/A  . Years of Education: N/A   Social History Main Topics  . Smoking status: Current Every Day Smoker -- 0.50 packs/day    Types: Cigarettes  . Smokeless tobacco: None  . Alcohol Use: No  . Drug Use: No  . Sexual Activity: Not Asked   Other Topics Concern  . None   Social History Narrative   Past Surgical History  Procedure Laterality Date  . Cesarean section  1983, 1987  . Stomaplasty  04/2015   Past Medical History  Diagnosis Date  . Hypertension   . Hyperlipemia   . Depression   . Diabetes (HCC)   . OA (osteoarthritis) 10/19/2015  . Depression 10/19/2015  . Diabetes mellitus type 2 in obese (HCC) 10/19/2015  . Chronic pain syndrome  10/19/2015   BP 149/69 mmHg  Pulse 101  SpO2 97%  Opioid Risk Score:   Fall Risk Score:  `1  Depression screen PHQ 2/9  [facility] 01/12/2016 10/19/2015  Decreased Interest 1 0  Down, Depressed, Hopeless 1 2  PHQ - 2 Score 2 2  Altered sleeping 1 0  Tired, decreased energy 2 0  Change in appetite 0 0  Feeling bad or failure about yourself  1 0  Trouble concentrating 0 0  Moving slowly or fidgety/restless 0 0  Suicidal thoughts 0 0  PHQ-9 Score 6 2  Difficult doing work/chores Not difficult at all Somewhat difficult     Review of Systems  Constitutional: Negative for fever and chills.    Endocrine:       High blood sugar  Musculoskeletal: Positive for back pain and gait problem. Negative for neck pain.  Neurological: Negative for dizziness and seizures.  All other systems reviewed and are negative.     Objective:   Physical Exam HENT: Normocephalic, Atraumatic Eyes: EOMI, Conj WNL Cardio: S1, S2 normal, RRR. Venous stasis changes b/l feet.  Pulm: B/l clear to auscultation. Effort normal Abd: Soft, non-distended, non-tender, BS+ MSK:  Antalgic gait.  TTP right hip diffusely, not particular TTP over greater trochanter.  +FABER for right hip pain, +FADER right hip +Hammer toes b/l LE No edema. Neuro: CN II-XII grossly intact.  Sensation intact to light touch diminished b/l feet. Motor RLE 4-/5 hip flexion, 4/5 knee extension, 4+/5 ankle dorsi/plantar flexion (all limited by pain) LLE 5/5 proximal to distal Skin: Venous stasis changes b/l feet.      Assessment & Plan:  60 y/o female with pmh of DM and depression presents for follow up with right hip pain.   1. 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   Steroid injection is not an option at present because the pt is concurrently receiving hyaluronic acid injections Pool therapy not recommended at present, per pt, by surgeon - plan for reversal once insurance processed Will not refill Norco as pt's UDS was inconsistent showing not only hydrocodone, but oxycodone and Etoh as well previously. Pt states she has not filled out all of there paperwork for financial assistance and should hear soon regarding Medicaid decision Pt states PT said it would be better for pt to wait until surgery prior to evaluation. Pt does not want to go to PT for  pain management.   Cont ice packs Tramadol 100mg  q6 PRN prescribed  Pt not interested in taking Cymbalta due to "things she's read".    2. Gait abnormality Cont rolling walker for safety and pain relief.   3. Hammer toes Referral to Orthotist deferred by pt at this time due to financial reasons  Pt does not want to see a podiatrist at present and would like to wait for improvement of her hip  4. Insomnia Pt is currently on Ambien, which she can continue taking

## 2016-02-23 ENCOUNTER — Encounter: Payer: Medicaid Other | Attending: Physical Medicine & Rehabilitation | Admitting: Physical Medicine & Rehabilitation

## 2016-02-23 DIAGNOSIS — M2041 Other hammer toe(s) (acquired), right foot: Secondary | ICD-10-CM | POA: Insufficient documentation

## 2016-02-23 DIAGNOSIS — G894 Chronic pain syndrome: Secondary | ICD-10-CM | POA: Insufficient documentation

## 2016-02-23 DIAGNOSIS — Z79899 Other long term (current) drug therapy: Secondary | ICD-10-CM | POA: Insufficient documentation

## 2016-02-23 DIAGNOSIS — Z5181 Encounter for therapeutic drug level monitoring: Secondary | ICD-10-CM | POA: Insufficient documentation

## 2016-02-23 DIAGNOSIS — E669 Obesity, unspecified: Secondary | ICD-10-CM | POA: Insufficient documentation

## 2016-02-23 DIAGNOSIS — E119 Type 2 diabetes mellitus without complications: Secondary | ICD-10-CM | POA: Insufficient documentation

## 2016-02-23 DIAGNOSIS — M1611 Unilateral primary osteoarthritis, right hip: Secondary | ICD-10-CM | POA: Insufficient documentation

## 2016-02-23 DIAGNOSIS — F329 Major depressive disorder, single episode, unspecified: Secondary | ICD-10-CM | POA: Insufficient documentation

## 2016-02-23 DIAGNOSIS — M2042 Other hammer toe(s) (acquired), left foot: Secondary | ICD-10-CM | POA: Insufficient documentation

## 2016-04-25 ENCOUNTER — Telehealth: Payer: Self-pay | Admitting: *Deleted

## 2016-04-25 NOTE — Telephone Encounter (Signed)
Pt called asking stating that she is having surgery tomorrow and wanted to make sure she was not violating any rules or laws by receiving pain medication from the surgeon. She literally asked us to 'remove the flag', presumably on her medical record that may prohibit other physicians from prescribing.  I informed the patient that her chart is currently not 'flagged' as she has yet to sign a contractual agreement with us.  Patient acknowledged.  She also stated that she woulfd not be coming back to see us again.  She feels confident that the surgery will be a success and will no longer need our services. I wished her well and informed that if she needed any help to please call

## 2016-04-26 HISTORY — PX: COLOSTOMY CLOSURE: SHX1381

## 2016-04-26 HISTORY — PX: INCISIONAL HERNIA REPAIR: SHX193

## 2016-04-28 DIAGNOSIS — Z8709 Personal history of other diseases of the respiratory system: Secondary | ICD-10-CM

## 2016-04-28 HISTORY — DX: Personal history of other diseases of the respiratory system: Z87.09

## 2016-05-05 DIAGNOSIS — K2210 Ulcer of esophagus without bleeding: Secondary | ICD-10-CM

## 2016-05-05 HISTORY — DX: Ulcer of esophagus without bleeding: K22.10

## 2016-08-08 ENCOUNTER — Encounter (HOSPITAL_COMMUNITY): Payer: Self-pay | Admitting: *Deleted

## 2016-08-08 ENCOUNTER — Other Ambulatory Visit: Payer: Self-pay | Admitting: Orthopaedic Surgery

## 2016-08-08 NOTE — Progress Notes (Signed)
Ms Craig GuessKiger has a history of Type II DM- take Metformin.  Patient checks CBG every now and then, states it runs 120- 150.  Patient thinks last A1C was 6.something, I received labs from her PCP, Dr Sharee PimpleJudge last A1C was 7.1 drawn 01/04/15.   Ms Craig GuessKiger had a colostomy in June of 2016- [place].  Patient reports that she had pneumonia after surgery and had to be place on the ventilator. Ms Craig GuessKiger had a col[facility] [place].  Post op day 2 patient developed fever, shortness of breath, tachycardia and was [facility]. A PICC line was placed and antibiotics and respiratory treatments were given.  Patient was discharged from a regular bed on 05/05/2016 . Patinet was instructed to follow up with a Pulmonologist, but patient did not, "I was fine, I didn't need to, I stopped smoking." Patient did see her PCP  On 06/29/16.   I informed Cordelia PenSherry with Dr Roda ShuttersXu that patient had not had a PAT visit and that she had respiratory failure/pneumonia after last 2 surgeriess.  Sherry informed Dr Roda ShuttersXU and she said she is ok for surgery tomorrow.  I also notified Cordelia PenSherry that had has continued to take Goody Powders with Aspirin, last time was 08/07/16. I spoke with Dr Krista BlueSinger regarding patient's history of respiratory failure/Pneumonia after past 2 surgeries and lack of Pulmonology follow  after surgery 04/26/16.  Dr Krista BlueSinger said that patient will be evaluated in am.  I instructed patient to check CBG to check CBG and if it is less than 70 to treat it with Glucose Gel, Glucose tablets or 1/2 cup of clear juice like apple juice or cranberry juice, or 1/2 cup of regular soda. (not cream soda). I instructed patient to recheck CBG in 15 minutes and if CBG is not greater than 70, to  Call [phone] (pre- op). If it is before pre-op opens to retreat as before and recheck CBG in 15 minutes. I told patient to make note of time that liquid is taken and  amount, that surgical time may have to be adjusted. Patient repeated instructions to me.

## 2016-08-09 ENCOUNTER — Inpatient Hospital Stay (HOSPITAL_COMMUNITY): Payer: Medicaid Other | Admitting: Anesthesiology

## 2016-08-09 ENCOUNTER — Encounter (HOSPITAL_COMMUNITY): Payer: Self-pay | Admitting: *Deleted

## 2016-08-09 ENCOUNTER — Inpatient Hospital Stay (HOSPITAL_COMMUNITY): Payer: Medicaid Other

## 2016-08-09 ENCOUNTER — Encounter (HOSPITAL_COMMUNITY)
Admission: RE | Disposition: A | Discharge status: Home / Self Care | Payer: Self-pay | Source: Ambulatory Visit | Attending: Orthopaedic Surgery

## 2016-08-09 ENCOUNTER — Inpatient Hospital Stay (HOSPITAL_COMMUNITY)
Admission: RE | Admit: 2016-08-09 | Discharge: 2016-08-11 | DRG: 470 | Disposition: A | Discharge status: Home / Self Care | Payer: Medicaid Other | Source: Ambulatory Visit | Attending: Orthopaedic Surgery | Admitting: Orthopaedic Surgery

## 2016-08-09 DIAGNOSIS — Z7984 Long term (current) use of oral hypoglycemic drugs: Secondary | ICD-10-CM

## 2016-08-09 DIAGNOSIS — G894 Chronic pain syndrome: Secondary | ICD-10-CM | POA: Diagnosis present

## 2016-08-09 DIAGNOSIS — E785 Hyperlipidemia, unspecified: Secondary | ICD-10-CM | POA: Diagnosis present

## 2016-08-09 DIAGNOSIS — E119 Type 2 diabetes mellitus without complications: Secondary | ICD-10-CM | POA: Diagnosis present

## 2016-08-09 DIAGNOSIS — Z96649 Presence of unspecified artificial hip joint: Secondary | ICD-10-CM

## 2016-08-09 DIAGNOSIS — Z419 Encounter for procedure for purposes other than remedying health state, unspecified: Secondary | ICD-10-CM

## 2016-08-09 DIAGNOSIS — Z8249 Family history of ischemic heart disease and other diseases of the circulatory system: Secondary | ICD-10-CM

## 2016-08-09 DIAGNOSIS — D62 Acute posthemorrhagic anemia: Secondary | ICD-10-CM | POA: Diagnosis not present

## 2016-08-09 DIAGNOSIS — Z87891 Personal history of nicotine dependence: Secondary | ICD-10-CM | POA: Diagnosis not present

## 2016-08-09 DIAGNOSIS — M1611 Unilateral primary osteoarthritis, right hip: Secondary | ICD-10-CM | POA: Diagnosis present

## 2016-08-09 DIAGNOSIS — Z933 Colostomy status: Secondary | ICD-10-CM

## 2016-08-09 DIAGNOSIS — I10 Essential (primary) hypertension: Secondary | ICD-10-CM | POA: Diagnosis present

## 2016-08-09 DIAGNOSIS — M25551 Pain in right hip: Secondary | ICD-10-CM | POA: Diagnosis present

## 2016-08-09 DIAGNOSIS — Z79891 Long term (current) use of opiate analgesic: Secondary | ICD-10-CM | POA: Diagnosis not present

## 2016-08-09 HISTORY — DX: Personal history of other diseases of the respiratory system: Z87.09

## 2016-08-09 HISTORY — DX: Dermatitis, unspecified: L30.9

## 2016-08-09 HISTORY — DX: Pneumonia, unspecified organism: J18.9

## 2016-08-09 HISTORY — DX: Unspecified osteoarthritis, unspecified site: M19.90

## 2016-08-09 HISTORY — DX: Ulcer of esophagus without bleeding: K22.10

## 2016-08-09 HISTORY — PX: TOTAL HIP ARTHROPLASTY: SHX124

## 2016-08-09 HISTORY — DX: Diverticulosis of small intestine without perforation or abscess without bleeding: K57.10

## 2016-08-09 LAB — CBC WITH DIFFERENTIAL/PLATELET
BASOS ABS: 0 10*3/uL (ref 0.0–0.1)
Basophils Relative: 1 %
Eosinophils Absolute: 0.4 10*3/uL (ref 0.0–0.7)
Eosinophils Relative: 6 %
HEMATOCRIT: 35.7 % — AB (ref 36.0–46.0)
Hemoglobin: 11.3 g/dL — ABNORMAL LOW (ref 12.0–15.0)
LYMPHS PCT: 33 %
Lymphs Abs: 2.1 10*3/uL (ref 0.7–4.0)
MCH: 28.2 pg (ref 26.0–34.0)
MCHC: 31.7 g/dL (ref 30.0–36.0)
MCV: 89 fL (ref 78.0–100.0)
MONO ABS: 0.4 10*3/uL (ref 0.1–1.0)
MONOS PCT: 7 %
NEUTROS ABS: 3.4 10*3/uL (ref 1.7–7.7)
Neutrophils Relative %: 53 %
Platelets: 310 10*3/uL (ref 150–400)
RBC: 4.01 MIL/uL (ref 3.87–5.11)
RDW: 13.2 % (ref 11.5–15.5)
WBC: 6.3 10*3/uL (ref 4.0–10.5)

## 2016-08-09 LAB — COMPREHENSIVE METABOLIC PANEL
ALK PHOS: 91 U/L (ref 38–126)
ALT: 27 U/L (ref 14–54)
AST: 32 U/L (ref 15–41)
Albumin: 4.1 g/dL (ref 3.5–5.0)
Anion gap: 12 (ref 5–15)
BUN: 11 mg/dL (ref 6–20)
CALCIUM: 9.5 mg/dL (ref 8.9–10.3)
CHLORIDE: 102 mmol/L (ref 101–111)
CO2: 23 mmol/L (ref 22–32)
CREATININE: 1.12 mg/dL — AB (ref 0.44–1.00)
GFR calc Af Amer: >60 mL/min (ref >60)
GFR, EST NON AFRICAN AMERICAN: 52 mL/min — AB (ref >60)
Glucose, Bld: 145 mg/dL — ABNORMAL HIGH (ref 65–99)
Potassium: 3.5 mmol/L (ref 3.5–5.1)
Sodium: 137 mmol/L (ref 135–145)
Total Bilirubin: 0.4 mg/dL (ref 0.3–1.2)
Total Protein: 6 g/dL — ABNORMAL LOW (ref 6.5–8.1)

## 2016-08-09 LAB — PROTIME-INR
INR: 0.9
PROTHROMBIN TIME: 12.2 s (ref 11.4–15.2)

## 2016-08-09 LAB — APTT: aPTT: 26 seconds (ref 24–36)

## 2016-08-09 LAB — C-REACTIVE PROTEIN: CRP: 0.9 mg/dL (ref <1.0)

## 2016-08-09 LAB — GLUCOSE, CAPILLARY
GLUCOSE-CAPILLARY: 173 mg/dL — AB (ref 65–99)
GLUCOSE-CAPILLARY: 189 mg/dL — AB (ref 65–99)
Glucose-Capillary: 152 mg/dL — ABNORMAL HIGH (ref 65–99)

## 2016-08-09 LAB — ABO/RH: ABO/RH(D): A POS

## 2016-08-09 LAB — TYPE AND SCREEN
ABO/RH(D): A POS
ANTIBODY SCREEN: NEGATIVE

## 2016-08-09 LAB — SEDIMENTATION RATE: Sed Rate: 11 mm/hr (ref 0–22)

## 2016-08-09 SURGERY — ARTHROPLASTY, HIP, TOTAL, ANTERIOR APPROACH
Anesthesia: Spinal | Site: Hip | Laterality: Right

## 2016-08-09 MED ORDER — CEFAZOLIN SODIUM 1 G IJ SOLR
INTRAMUSCULAR | Status: AC
  Filled 2016-08-09: qty 20

## 2016-08-09 MED ORDER — ONDANSETRON HCL 4 MG PO TABS: 4.0000 mg | ORAL_TABLET | Freq: Four times a day (QID) | ORAL | Status: DC | PRN

## 2016-08-09 MED ORDER — FENTANYL CITRATE (PF) 100 MCG/2ML IJ SOLN
INTRAMUSCULAR | Status: AC
  Filled 2016-08-09: qty 2

## 2016-08-09 MED ORDER — DEXAMETHASONE SODIUM PHOSPHATE 10 MG/ML IJ SOLN
INTRAMUSCULAR | Status: AC
  Filled 2016-08-09: qty 2

## 2016-08-09 MED ORDER — KETOROLAC TROMETHAMINE 30 MG/ML IJ SOLN
INTRAMUSCULAR | Status: AC
  Administered 2016-08-09: 30 mg
  Filled 2016-08-09: qty 1

## 2016-08-09 MED ORDER — MENTHOL 3 MG MT LOZG: 1.0000 | LOZENGE | OROMUCOSAL | Status: DC | PRN

## 2016-08-09 MED ORDER — ACETAMINOPHEN 500 MG PO TABS
1000.0000 mg | ORAL_TABLET | Freq: Four times a day (QID) | ORAL | Status: AC
  Administered 2016-08-09 – 2016-08-10 (×4): 1000 mg via ORAL
  Filled 2016-08-09 (×4): qty 2

## 2016-08-09 MED ORDER — METHOCARBAMOL 1000 MG/10ML IJ SOLN
500.0000 mg | Freq: Four times a day (QID) | INTRAVENOUS | Status: DC | PRN
  Filled 2016-08-09: qty 5

## 2016-08-09 MED ORDER — ZOLPIDEM TARTRATE 5 MG PO TABS
10.0000 mg | ORAL_TABLET | Freq: Every day | ORAL | Status: DC
Start: 2016-08-09 — End: 2016-08-11
  Administered 2016-08-10 (×2): 10 mg via ORAL
  Filled 2016-08-09 (×2): qty 2

## 2016-08-09 MED ORDER — METOCLOPRAMIDE HCL 5 MG PO TABS: 5.0000 mg | ORAL_TABLET | Freq: Three times a day (TID) | ORAL | Status: DC | PRN

## 2016-08-09 MED ORDER — POVIDONE-IODINE 10 % EX SOLN
CUTANEOUS | Status: DC | PRN
  Administered 2016-08-09: 1 via TOPICAL

## 2016-08-09 MED ORDER — POLYETHYLENE GLYCOL 3350 17 G PO PACK: 17.0000 g | PACK | Freq: Every day | ORAL | Status: DC | PRN

## 2016-08-09 MED ORDER — PHENYLEPHRINE 40 MCG/ML (10ML) SYRINGE FOR IV PUSH (FOR BLOOD PRESSURE SUPPORT)
PREFILLED_SYRINGE | INTRAVENOUS | Status: AC
  Filled 2016-08-09: qty 10

## 2016-08-09 MED ORDER — AMITRIPTYLINE HCL 50 MG PO TABS
50.0000 mg | ORAL_TABLET | Freq: Every day | ORAL | Status: DC
  Administered 2016-08-09 – 2016-08-10 (×2): 50 mg via ORAL
  Filled 2016-08-09 (×2): qty 1

## 2016-08-09 MED ORDER — DIPHENHYDRAMINE HCL 12.5 MG/5ML PO ELIX: 25.0000 mg | ORAL_SOLUTION | ORAL | Status: DC | PRN

## 2016-08-09 MED ORDER — METHOCARBAMOL 500 MG PO TABS
500.0000 mg | ORAL_TABLET | Freq: Four times a day (QID) | ORAL | Status: DC | PRN
  Administered 2016-08-09: 500 mg via ORAL

## 2016-08-09 MED ORDER — MUPIROCIN 2 % EX OINT: 1.0000 "application " | TOPICAL_OINTMENT | Freq: Once | CUTANEOUS | Status: DC

## 2016-08-09 MED ORDER — CLINDAMYCIN PHOSPHATE 900 MG/50ML IV SOLN
900.0000 mg | INTRAVENOUS | Status: AC
  Administered 2016-08-09: 900 mg via INTRAVENOUS
  Filled 2016-08-09: qty 50

## 2016-08-09 MED ORDER — TRANEXAMIC ACID 1000 MG/10ML IV SOLN
INTRAVENOUS | Status: DC | PRN
  Administered 2016-08-09: 2000 mg via TOPICAL

## 2016-08-09 MED ORDER — FENTANYL CITRATE (PF) 100 MCG/2ML IJ SOLN
INTRAMUSCULAR | Status: DC | PRN
  Administered 2016-08-09: 50 ug via INTRAVENOUS

## 2016-08-09 MED ORDER — SODIUM CHLORIDE 0.9 % IV SOLN
2000.0000 mg | INTRAVENOUS | Status: DC
  Filled 2016-08-09: qty 20

## 2016-08-09 MED ORDER — KETOROLAC TROMETHAMINE 30 MG/ML IJ SOLN
30.0000 mg | Freq: Four times a day (QID) | INTRAMUSCULAR | Status: DC | PRN
Start: 2016-08-09 — End: 2016-08-11
  Administered 2016-08-10: 30 mg via INTRAVENOUS
  Filled 2016-08-09: qty 1

## 2016-08-09 MED ORDER — METOCLOPRAMIDE HCL 5 MG/ML IJ SOLN: 5.0000 mg | Freq: Three times a day (TID) | INTRAMUSCULAR | Status: DC | PRN

## 2016-08-09 MED ORDER — SENNOSIDES-DOCUSATE SODIUM 8.6-50 MG PO TABS: 1.0000 | ORAL_TABLET | Freq: Every evening | ORAL | 1 refills | Status: AC | PRN

## 2016-08-09 MED ORDER — PHENYLEPHRINE HCL 10 MG/ML IJ SOLN
INTRAMUSCULAR | Status: DC | PRN
  Administered 2016-08-09 (×2): 120 ug via INTRAVENOUS
  Administered 2016-08-09: 40 ug via INTRAVENOUS
  Administered 2016-08-09: 120 ug via INTRAVENOUS

## 2016-08-09 MED ORDER — METHOCARBAMOL 500 MG PO TABS
ORAL_TABLET | ORAL | Status: AC
  Filled 2016-08-09: qty 1

## 2016-08-09 MED ORDER — PHENOL 1.4 % MT LIQD: 1.0000 | OROMUCOSAL | Status: DC | PRN

## 2016-08-09 MED ORDER — HYDROMORPHONE HCL 1 MG/ML IJ SOLN
0.2500 mg | INTRAMUSCULAR | Status: DC | PRN
  Administered 2016-08-09: 0.5 mg via INTRAVENOUS

## 2016-08-09 MED ORDER — INSULIN ASPART 100 UNIT/ML ~~LOC~~ SOLN
0.0000 [IU] | Freq: Every day | SUBCUTANEOUS | Status: DC
  Administered 2016-08-10: 2 [IU] via SUBCUTANEOUS

## 2016-08-09 MED ORDER — TRANEXAMIC ACID 1000 MG/10ML IV SOLN
1000.0000 mg | INTRAVENOUS | Status: AC
  Administered 2016-08-09: 1000 mg via INTRAVENOUS
  Filled 2016-08-09: qty 10

## 2016-08-09 MED ORDER — OXYCODONE HCL 5 MG PO TABS
ORAL_TABLET | ORAL | Status: AC
  Filled 2016-08-09: qty 2

## 2016-08-09 MED ORDER — MEPERIDINE HCL 25 MG/ML IJ SOLN: 6.2500 mg | INTRAMUSCULAR | Status: DC | PRN

## 2016-08-09 MED ORDER — CHLORHEXIDINE GLUCONATE 4 % EX LIQD: 60.0000 mL | Freq: Once | CUTANEOUS | Status: DC

## 2016-08-09 MED ORDER — METHOCARBAMOL 750 MG PO TABS: 750.0000 mg | ORAL_TABLET | Freq: Two times a day (BID) | ORAL | 0 refills | Status: AC | PRN

## 2016-08-09 MED ORDER — INSULIN ASPART 100 UNIT/ML ~~LOC~~ SOLN
0.0000 [IU] | Freq: Three times a day (TID) | SUBCUTANEOUS | Status: DC
  Administered 2016-08-10: 2 [IU] via SUBCUTANEOUS
  Administered 2016-08-10: 5 [IU] via SUBCUTANEOUS
  Administered 2016-08-10: 7 [IU] via SUBCUTANEOUS
  Administered 2016-08-11: 2 [IU] via SUBCUTANEOUS

## 2016-08-09 MED ORDER — HYDROMORPHONE HCL 1 MG/ML IJ SOLN
INTRAMUSCULAR | Status: AC
  Filled 2016-08-09: qty 1

## 2016-08-09 MED ORDER — NEOSTIGMINE METHYLSULFATE 5 MG/5ML IV SOSY
PREFILLED_SYRINGE | INTRAVENOUS | Status: AC
  Filled 2016-08-09: qty 5

## 2016-08-09 MED ORDER — MIDAZOLAM HCL 5 MG/5ML IJ SOLN
INTRAMUSCULAR | Status: DC | PRN
  Administered 2016-08-09: 2 mg via INTRAVENOUS

## 2016-08-09 MED ORDER — LIDOCAINE 2% (20 MG/ML) 5 ML SYRINGE
INTRAMUSCULAR | Status: AC
  Filled 2016-08-09: qty 15

## 2016-08-09 MED ORDER — SODIUM CHLORIDE 0.9 % IV SOLN
INTRAVENOUS | Status: DC
  Administered 2016-08-09: 22:00:00 via INTRAVENOUS

## 2016-08-09 MED ORDER — DEXAMETHASONE SODIUM PHOSPHATE 10 MG/ML IJ SOLN
10.0000 mg | Freq: Once | INTRAMUSCULAR | Status: AC
  Administered 2016-08-10: 10 mg via INTRAVENOUS
  Filled 2016-08-09: qty 1

## 2016-08-09 MED ORDER — ROCURONIUM BROMIDE 10 MG/ML (PF) SYRINGE
PREFILLED_SYRINGE | INTRAVENOUS | Status: AC
  Filled 2016-08-09: qty 20

## 2016-08-09 MED ORDER — BUPIVACAINE HCL (PF) 0.5 % IJ SOLN
INTRAMUSCULAR | Status: DC | PRN
  Administered 2016-08-09: 3 mL via INTRATHECAL

## 2016-08-09 MED ORDER — GLYCOPYRROLATE 0.2 MG/ML IV SOSY
PREFILLED_SYRINGE | INTRAVENOUS | Status: AC
  Filled 2016-08-09: qty 3

## 2016-08-09 MED ORDER — SODIUM CHLORIDE 0.9 % IR SOLN
Status: DC | PRN
  Administered 2016-08-09: 3000 mL
  Administered 2016-08-09: 1000 mL

## 2016-08-09 MED ORDER — ACETAMINOPHEN 650 MG RE SUPP: 650.0000 mg | Freq: Four times a day (QID) | RECTAL | Status: DC | PRN

## 2016-08-09 MED ORDER — OXYCODONE HCL 5 MG PO TABS
5.0000 mg | ORAL_TABLET | ORAL | Status: DC | PRN
  Administered 2016-08-09 (×2): 10 mg via ORAL
  Administered 2016-08-10: 15 mg via ORAL
  Administered 2016-08-10: 10 mg via ORAL
  Administered 2016-08-10 – 2016-08-11 (×2): 15 mg via ORAL
  Filled 2016-08-09 (×2): qty 3
  Filled 2016-08-09 (×2): qty 2
  Filled 2016-08-09: qty 3

## 2016-08-09 MED ORDER — ACETAMINOPHEN 325 MG PO TABS: 650.0000 mg | ORAL_TABLET | Freq: Four times a day (QID) | ORAL | Status: DC | PRN

## 2016-08-09 MED ORDER — TRIAMTERENE-HCTZ 37.5-25 MG PO TABS
1.0000 | ORAL_TABLET | Freq: Two times a day (BID) | ORAL | Status: DC
  Administered 2016-08-10 – 2016-08-11 (×3): 1 via ORAL
  Filled 2016-08-09 (×4): qty 1

## 2016-08-09 MED ORDER — PHENYLEPHRINE HCL 10 MG/ML IJ SOLN
INTRAVENOUS | Status: DC | PRN
  Administered 2016-08-09: 30 ug/min via INTRAVENOUS

## 2016-08-09 MED ORDER — PRAVASTATIN SODIUM 20 MG PO TABS
20.0000 mg | ORAL_TABLET | Freq: Every day | ORAL | Status: DC
  Administered 2016-08-09 – 2016-08-10 (×2): 20 mg via ORAL
  Filled 2016-08-09 (×2): qty 1

## 2016-08-09 MED ORDER — ONDANSETRON HCL 4 MG/2ML IJ SOLN
INTRAMUSCULAR | Status: AC
  Filled 2016-08-09: qty 4

## 2016-08-09 MED ORDER — PROPOFOL 10 MG/ML IV BOLUS
INTRAVENOUS | Status: DC | PRN
  Administered 2016-08-09: 10 mg via INTRAVENOUS
  Administered 2016-08-09: 20 mg via INTRAVENOUS

## 2016-08-09 MED ORDER — PANTOPRAZOLE SODIUM 40 MG PO TBEC: 40.0000 mg | DELAYED_RELEASE_TABLET | Freq: Every day | ORAL | Status: DC | PRN

## 2016-08-09 MED ORDER — DEXAMETHASONE SODIUM PHOSPHATE 10 MG/ML IJ SOLN
INTRAMUSCULAR | Status: DC | PRN
  Administered 2016-08-09: 5 mg via INTRAVENOUS

## 2016-08-09 MED ORDER — OXYCODONE HCL 5 MG PO TABS: 5.0000 mg | ORAL_TABLET | ORAL | 0 refills | Status: AC | PRN

## 2016-08-09 MED ORDER — CEFAZOLIN SODIUM-DEXTROSE 2-4 GM/100ML-% IV SOLN
2.0000 g | Freq: Four times a day (QID) | INTRAVENOUS | Status: AC
  Administered 2016-08-09 – 2016-08-10 (×2): 2 g via INTRAVENOUS
  Filled 2016-08-09 (×2): qty 100

## 2016-08-09 MED ORDER — LIDOCAINE HCL (CARDIAC) 20 MG/ML IV SOLN
INTRAVENOUS | Status: DC | PRN
  Administered 2016-08-09: 30 mg via INTRATRACHEAL

## 2016-08-09 MED ORDER — BUPROPION HCL ER (XL) 150 MG PO TB24
150.0000 mg | ORAL_TABLET | Freq: Every day | ORAL | Status: DC
  Administered 2016-08-10 – 2016-08-11 (×2): 150 mg via ORAL
  Filled 2016-08-09 (×2): qty 1

## 2016-08-09 MED ORDER — ONDANSETRON HCL 4 MG PO TABS: 4.0000 mg | ORAL_TABLET | Freq: Three times a day (TID) | ORAL | 0 refills | Status: AC | PRN

## 2016-08-09 MED ORDER — ASPIRIN EC 325 MG PO TBEC
325.0000 mg | DELAYED_RELEASE_TABLET | Freq: Two times a day (BID) | ORAL | Status: DC
Start: 2016-08-10 — End: 2016-08-11
  Administered 2016-08-10 – 2016-08-11 (×3): 325 mg via ORAL
  Filled 2016-08-09 (×3): qty 1

## 2016-08-09 MED ORDER — ALUM & MAG HYDROXIDE-SIMETH 200-200-20 MG/5ML PO SUSP: 30.0000 mL | ORAL | Status: DC | PRN

## 2016-08-09 MED ORDER — CEFAZOLIN SODIUM-DEXTROSE 2-3 GM-% IV SOLR
INTRAVENOUS | Status: DC | PRN
  Administered 2016-08-09: 2 g via INTRAVENOUS

## 2016-08-09 MED ORDER — MIDAZOLAM HCL 2 MG/2ML IJ SOLN
INTRAMUSCULAR | Status: AC
  Filled 2016-08-09: qty 2

## 2016-08-09 MED ORDER — OXYCODONE HCL ER 10 MG PO T12A
10.0000 mg | EXTENDED_RELEASE_TABLET | Freq: Two times a day (BID) | ORAL | Status: DC
  Administered 2016-08-09 – 2016-08-11 (×4): 10 mg via ORAL
  Filled 2016-08-09 (×4): qty 1

## 2016-08-09 MED ORDER — 0.9 % SODIUM CHLORIDE (POUR BTL) OPTIME
TOPICAL | Status: DC | PRN
  Administered 2016-08-09: 1000 mL

## 2016-08-09 MED ORDER — PROMETHAZINE HCL 25 MG/ML IJ SOLN
6.2500 mg | INTRAMUSCULAR | Status: DC | PRN
Start: 2016-08-09 — End: 2016-08-09

## 2016-08-09 MED ORDER — SUGAMMADEX SODIUM 200 MG/2ML IV SOLN
INTRAVENOUS | Status: AC
  Filled 2016-08-09: qty 2

## 2016-08-09 MED ORDER — BUPIVACAINE LIPOSOME 1.3 % IJ SUSP
20.0000 mL | INTRAMUSCULAR | Status: DC
  Filled 2016-08-09: qty 20

## 2016-08-09 MED ORDER — ASPIRIN EC 325 MG PO TBEC: 325.0000 mg | DELAYED_RELEASE_TABLET | Freq: Two times a day (BID) | ORAL | 0 refills | Status: AC

## 2016-08-09 MED ORDER — PROPOFOL 10 MG/ML IV BOLUS
INTRAVENOUS | Status: AC
  Filled 2016-08-09: qty 20

## 2016-08-09 MED ORDER — ONDANSETRON HCL 4 MG/2ML IJ SOLN: 4.0000 mg | Freq: Four times a day (QID) | INTRAMUSCULAR | Status: DC | PRN

## 2016-08-09 MED ORDER — MORPHINE SULFATE (PF) 2 MG/ML IV SOLN: 1.0000 mg | INTRAVENOUS | Status: DC | PRN

## 2016-08-09 MED ORDER — MAGNESIUM CITRATE PO SOLN
1.0000 | Freq: Once | ORAL | Status: DC | PRN
Start: 2016-08-09 — End: 2016-08-11

## 2016-08-09 MED ORDER — SORBITOL 70 % SOLN: 30.0000 mL | Freq: Every day | Status: DC | PRN

## 2016-08-09 MED ORDER — PROPOFOL 500 MG/50ML IV EMUL
INTRAVENOUS | Status: DC | PRN
  Administered 2016-08-09: 100 ug/kg/min via INTRAVENOUS

## 2016-08-09 MED ORDER — LACTATED RINGERS IV SOLN
INTRAVENOUS | Status: DC
  Administered 2016-08-09 (×3): via INTRAVENOUS

## 2016-08-09 SURGICAL SUPPLY — 55 items
BAG DECANTER FOR FLEXI CONT (MISCELLANEOUS) ×3 IMPLANT
CAPT HIP TOTAL 2 ×3 IMPLANT
CELLS DAT CNTRL 66122 CELL SVR (MISCELLANEOUS) ×1 IMPLANT
CLOSURE WOUND 1/2 X4 (GAUZE/BANDAGES/DRESSINGS) ×1
COVER SURGICAL LIGHT HANDLE (MISCELLANEOUS) ×3 IMPLANT
DRAPE C-ARM 42X72 X-RAY (DRAPES) ×3 IMPLANT
DRAPE ORTHO SPLIT 87X125 STRL (DRAPES) ×3 IMPLANT
DRAPE STERI IOBAN 125X83 (DRAPES) ×3 IMPLANT
DRAPE U-SHAPE 47X51 STRL (DRAPES) ×9 IMPLANT
DRSG AQUACEL AG ADV 3.5X10 (GAUZE/BANDAGES/DRESSINGS) ×3 IMPLANT
DURAPREP 26ML APPLICATOR (WOUND CARE) ×3 IMPLANT
ELECT BLADE 4.0 EZ CLEAN MEGAD (MISCELLANEOUS) ×3
ELECT REM PT RETURN 9FT ADLT (ELECTROSURGICAL) ×3
ELECTRODE BLDE 4.0 EZ CLN MEGD (MISCELLANEOUS) ×1 IMPLANT
ELECTRODE REM PT RTRN 9FT ADLT (ELECTROSURGICAL) ×1 IMPLANT
GLOVE SKINSENSE NS SZ7.5 (GLOVE) ×2
GLOVE SKINSENSE STRL SZ7.5 (GLOVE) ×1 IMPLANT
GLOVE SURG SYN 7.5  E (GLOVE) ×4
GLOVE SURG SYN 7.5 E (GLOVE) ×2 IMPLANT
GOWN SRG XL XLNG 56XLVL 4 (GOWN DISPOSABLE) ×1 IMPLANT
GOWN STRL NON-REIN XL XLG LVL4 (GOWN DISPOSABLE) ×2
GOWN STRL REUS W/ TWL LRG LVL3 (GOWN DISPOSABLE) IMPLANT
GOWN STRL REUS W/TWL LRG LVL3 (GOWN DISPOSABLE)
HANDPIECE INTERPULSE COAX TIP (DISPOSABLE) ×2
HOOD PEEL AWAY FLYTE STAYCOOL (MISCELLANEOUS) ×6 IMPLANT
IV NS 1000ML (IV SOLUTION) ×2
IV NS 1000ML BAXH (IV SOLUTION) ×1 IMPLANT
IV NS IRRIG 3000ML ARTHROMATIC (IV SOLUTION) ×3 IMPLANT
KIT BASIN OR (CUSTOM PROCEDURE TRAY) ×3 IMPLANT
MARKER SKIN DUAL TIP RULER LAB (MISCELLANEOUS) ×3 IMPLANT
NEEDLE 18GX1X1/2 (RX/OR ONLY) (NEEDLE) ×3 IMPLANT
NEEDLE SPNL 18GX3.5 QUINCKE PK (NEEDLE) ×3 IMPLANT
PACK TOTAL JOINT (CUSTOM PROCEDURE TRAY) ×3 IMPLANT
PACK UNIVERSAL I (CUSTOM PROCEDURE TRAY) ×3 IMPLANT
RTRCTR WOUND ALEXIS 18CM MED (MISCELLANEOUS) ×3
SAW OSC TIP CART 19.5X105X1.3 (SAW) ×3 IMPLANT
SEALER BIPOLAR AQUA 6.0 (INSTRUMENTS) ×3 IMPLANT
SET HNDPC FAN SPRY TIP SCT (DISPOSABLE) ×1 IMPLANT
STAPLER VISISTAT 35W (STAPLE) IMPLANT
STRIP CLOSURE SKIN 1/2X4 (GAUZE/BANDAGES/DRESSINGS) ×2 IMPLANT
SUT ETHIBOND 2 V 37 (SUTURE) ×3 IMPLANT
SUT ETHIBOND NAB CT1 #1 30IN (SUTURE) IMPLANT
SUT MNCRL AB 3-0 PS2 18 (SUTURE) ×3 IMPLANT
SUT VIC AB 0 CT1 27 (SUTURE) ×3
SUT VIC AB 0 CT1 27XBRD ANBCTR (SUTURE) ×1 IMPLANT
SUT VIC AB 1 CT1 27 (SUTURE) ×2
SUT VIC AB 1 CT1 27XBRD ANBCTR (SUTURE) ×1 IMPLANT
SUT VIC AB 2-0 CT1 27 (SUTURE) ×2
SUT VIC AB 2-0 CT1 TAPERPNT 27 (SUTURE) ×1 IMPLANT
SYR 20CC LL (SYRINGE) ×3 IMPLANT
SYR 50ML LL SCALE MARK (SYRINGE) ×3 IMPLANT
SYRINGE 20CC LL (MISCELLANEOUS) ×2 IMPLANT
TOWEL OR 17X26 10 PK STRL BLUE (TOWEL DISPOSABLE) ×3 IMPLANT
TRAY CATH 16FR W/PLASTIC CATH (SET/KITS/TRAYS/PACK) ×3 IMPLANT
YANKAUER SUCT BULB TIP NO VENT (SUCTIONS) ×3 IMPLANT

## 2016-08-09 NOTE — Anesthesia Postprocedure Evaluation (Signed)
Anesthesia Post Note  Patient: Tamara York  Procedure(s) Performed: Procedure(s) (LRB): RIGHT TOTAL HIP ARTHROPLASTY ANTERIOR APPROACH (Right)  Patient location during evaluation: PACU Anesthesia Type: Spinal Level of consciousness: oriented and awake and alert Pain management: pain level controlled Vital Signs Assessment: post-procedure vital signs reviewed and stable Respiratory status: spontaneous breathing, respiratory function stable and patient connected to nasal cannula oxygen Cardiovascular status: blood pressure returned to baseline and stable Postop Assessment: no headache, no backache, spinal receding and patient able to bend at knees Anesthetic complications: no    Last Vitals:  Vitals:   08/09/16 1835 08/09/16 1930  BP:  131/89  Pulse: 93 (!) 119  Resp: 14 (!) 23  Temp:      Last Pain:  Vitals:   08/09/16 1835  TempSrc:   PainSc: 0-No pain                 Halle Davlin A

## 2016-08-09 NOTE — Anesthesia Procedure Notes (Signed)
Spinal  Patient location during procedure: OR Staffing Performed: anesthesiologist  Preanesthetic Checklist Completed: patient identified, site marked, surgical consent, pre-op evaluation, timeout performed, IV checked, risks and benefits discussed and monitors and equipment checked Spinal Block Patient position: sitting Prep: Betadine Patient monitoring: heart rate, continuous pulse ox and blood pressure Approach: right paramedian Location: L3-4 Injection technique: single-shot Needle Needle type: Sprotte  Needle gauge: 24 G Needle length: 9 cm Additional Notes Expiration date of kit checked and confirmed. Patient tolerated procedure well, without complications.

## 2016-08-09 NOTE — H&P (Signed)
PREOPERATIVE H&P  Chief Complaint: right hip degenerative joint disease  HPI: Tamara York is a 60 y.o. female who presents for surgical treatment of right hip degenerative joint disease.  She denies any changes in medical history.  Past Medical History:  Diagnosis Date  . Chronic pain syndrome 10/19/2015  . Depression   . Depression 10/19/2015  . Diabetes (HCC)   . Diabetes mellitus type 2 in obese (HCC) 10/19/2015  . Diverticula of small intestine   . DJD (degenerative joint disease)   . Eczema   . Hx of acute respiratory failure 04/28/2016   post op  . Hyperlipemia   . Hypertension   . OA (osteoarthritis) 10/19/2015  . Pneumonia 04/2015   respitatory failure post op 04/2015  . 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 CESAREAN SECTION  1983, 1987  . COLONOSCOPY    . COLOSTOMY  04/25/2015  . COLOSTOMY CLOSURE  04/26/2016  . INCISIONAL HERNIA REPAIR  04/26/2016  . STOMAPLASTY  04/2015  . UPPER GI ENDOSCOPY     Social History   Social History  . Marital status: Widowed    Spouse name: N/A  . Number of children: N/A  . Years of education: N/A   Social History Main Topics  . Smoking status: Former Smoker    Packs/day: 0.50    Years: 40.00    Types: Cigarettes    Quit date: 04/26/2016  . Smokeless tobacco: Never Used  . Alcohol use No  . Drug use: No  . Sexual activity: Not Asked   Other Topics Concern  . None   Social History Narrative  . None   Family History  Problem Relation Age of Onset  . Heart failure Mother   . Heart disease Father    Allergies  Allergen Reactions  . Penicillins Hives   Prior to Admission medications   Medication Sig Start Date End Date Taking? Authorizing Provider  amitriptyline (ELAVIL) 50 MG tablet Take 50 mg by mouth at bedtime.   Yes Historical Provider, MD  Aspirin-Acetaminophen (GOODYS BODY PAIN PO) Take 1 packet by mouth daily as needed (headache).    Yes Historical  Provider, MD  buPROPion (WELLBUTRIN XL) 150 MG 24 hr tablet Take 150 mg by mouth daily.   Yes Historical Provider, MD  HYDROcodone-acetaminophen (NORCO/VICODIN) 5-325 MG tablet Take 1 tablet by mouth every 6 (six) hours as needed for moderate pain.   Yes Historical Provider, MD  metFORMIN (GLUCOPHAGE-XR) 500 MG 24 hr tablet Take 500 mg by mouth daily with breakfast.   Yes Historical Provider, MD  pantoprazole (PROTONIX) 40 MG tablet Take 40 mg by mouth daily as needed (ulcers).   Yes Historical Provider, MD  pravastatin (PRAVACHOL) 20 MG tablet Take 20 mg by mouth daily.   Yes Historical Provider, MD  traMADol (ULTRAM) 50 MG tablet Take 2 tablets (100 mg total) by mouth every 6 (six) hours as needed. Patient taking differently: Take 100 mg by mouth every 6 (six) hours as needed for moderate pain.  01/12/16  Yes Ankit Karis Juba, MD  triamterene-hydrochlorothiazide (MAXZIDE-25) 37.5-25 MG tablet Take 1 tablet by mouth 2 (two) times daily.   Yes Historical Provider, MD  zolpidem (AMBIEN) 10 MG tablet Take 10 mg by mouth at bedtime.   Yes Historical Provider, MD     Positive ROS: All other systems have been reviewed and were otherwise negative with the exception of those mentioned in the HPI and as  above.  Physical Exam: General: Alert, no acute distress Cardiovascular: No pedal edema Respiratory: No cyanosis, no use of accessory musculature GI: abdomen soft Skin: No lesions in the area of chief complaint Neurologic: Sensation intact distally Psychiatric: Patient is competent for consent with normal mood and affect Lymphatic: no lymphedema  MUSCULOSKELETAL: exam stable  Assessment: right hip degenerative joint disease  Plan: Plan for Procedure(s): RIGHT TOTAL HIP ARTHROPLASTY ANTERIOR APPROACH  The risks benefits and alternatives were discussed with the patient including but not limited to the risks of nonoperative treatment, versus surgical intervention including infection, bleeding,  nerve injury,  blood clots, cardiopulmonary complications, morbidity, mortality, among others, and they were willing to proceed.   Cheral AlmasXu, Naiping Michael, MD   08/09/2016 11:04 AM

## 2016-08-09 NOTE — Discharge Instructions (Signed)

## 2016-08-09 NOTE — Op Note (Signed)
RIGHT TOTAL HIP ARTHROPLASTY ANTERIOR APPROACH  Procedure Note LADANA CHAVERO   [id number]  Pre-op Diagnosis: right hip degenerative joint disease     Post-op Diagnosis: same   Operative Procedures  1. Total hip replacement; Right hip; uncemented [id number]   Personnel  Surgeon(s): Tarry Kos, MD   Anesthesia: spinal  Prosthesis: Depuy Acetabulum: Pinnacle 52 mm Femur: Corail KA11 Head: 36 mm size: +1.5 Liner: +4 Bearing Type: Ceramic on poly  Date of Service: 08/09/2016  Total Hip Arthroplasty (Anterior Approach) Op Note:  After informed consent was obtained and the operative extremity marked in the holding area, the patient was brought back to the operating room and placed supine on the HANA table. Next, the operative extremity was prepped and draped in normal sterile fashion. Surgical timeout occurred verifying patient identification, surgical site, surgical procedure and administration of antibiotics.  A modified anterior Smith-Peterson approach to the hip was performed, using the interval between tensor fascia lata and sartorius.  Dissection was carried bluntly down onto the anterior hip capsule. The lateral femoral circumflex vessels were identified and coagulated. A capsulotomy was performed and the capsular flaps tagged for later repair.  Fluoroscopy was utilized to prepare for the femoral neck cut. The neck osteotomy was performed. The femoral head was removed, the acetabular rim was cleared of soft tissue and attention was turned to reaming the acetabulum.  Sequential reaming was performed under fluoroscopic guidance. We reamed to a size 51 mm, and then impacted the acetabular shell. The liner was then placed after irrigation and attention turned to the femur.  After placing the femoral hook, the leg was taken to externally rotated, extended and adducted position taking care to perform soft tissue releases to allow for adequate mobilization of the femur. Soft tissue was  cleared from the shoulder of the greater trochanter and the hook elevator used to improve exposure of the proximal femur. Sequential broaching performed up to a size 11. Trial neck and head were placed. The leg was brought back up to neutral and the construct reduced. The position and sizing of components, offset and leg lengths were checked using fluoroscopy. Stability of the construct was checked in extension and external rotation without any subluxation or impingement of prosthesis. We dislocated the prosthesis, dropped the leg back into position, removed trial components, and irrigated copiously. The final stem and head was then placed, the leg brought back up, the system reduced and fluoroscopy used to verify positioning.  We irrigated, obtained hemostasis and closed the capsule using #2 ethibond suture.  Dilute betadyne solution was used. The fascia was closed with #1 vicryl plus, the deep fat layer was closed with 0 vicryl, the subcutaneous layers closed with 2.0 Vicryl Plus and the skin closed with 3.0 monocryl and steri strips. A sterile dressing was applied. The patient was awakened in the operating room and taken to recovery in stable condition.  All sponge, needle, and instrument counts were correct at the end of the case.   Position: supine  Complications: none.  Time Out: performed   Drains/Packing: none  Estimated blood loss: 300 cc  Returned to Recovery Room: in good condition.   Antibiotics: yes   Mechanical VTE (DVT) Prophylaxis: sequential compression devices, TED thigh-high  Chemical VTE (DVT) Prophylaxis: aspirin   Fluid Replacement: see anesthesia record  Specimens Removed: 1 to pathology   Sponge and Instrument Count Correct? yes   PACU: portable radiograph - low AP   Admission: inpatient status, start PT &  OT POD#1  Plan/RTC: Return in 2 weeks for staple removal. Return in 6 weeks to see MD.  Weight Bearing/Load Lower Extremity: full  Hip precautions:  none Suture Removal: 10-14 days  Betadine to incision twice daily once dressing is removed on POD#7  N. Glee ArvinMichael Xu, MD [facility] [phone] 3:11 PM      Implant Name Type Inv. Item Serial No. Manufacturer Lot No. LRB No. Used  PIN SECTOR W/GRIP ACE CUP 52MM - [id number] Hips PIN SECTOR W/GRIP ACE CUP 52MM  DEPUY [id number] Right 1  SCREW 6.5MMX25MM - [id number] Screw SCREW 6.5MMX25MM  DEPUY [id number] Right 1  LINER NEUTRAL 52X36MM PLUS 4 - [id number] Liner LINER NEUTRAL 52X36MM PLUS 4  DEPUY [id number] Right 1  STEM CORAIL KA11 - [id number] Stem STEM CORAIL KA11  DEPUY [id number] Right 1  HEAD CERAMIC DELTA 36 PLUS 1.5 - [id number] Hips HEAD CERAMIC DELTA 36 PLUS 1.5   DEPUY [id number] Right 1

## 2016-08-09 NOTE — Anesthesia Preprocedure Evaluation (Signed)
Anesthesia Evaluation  Patient identified by MRN, date of birth, ID band Patient awake    Reviewed: Allergy & Precautions, NPO status , Patient's Chart, lab work & pertinent test results  Airway Mallampati: II  TM Distance: >3 FB Neck ROM: Full    Dental no notable dental hx.    Pulmonary pneumonia, former smoker,    Pulmonary exam normal breath sounds clear to auscultation       Cardiovascular hypertension, Normal cardiovascular exam Rhythm:Regular Rate:Normal     Neuro/Psych PSYCHIATRIC DISORDERS Depression negative neurological ROS     GI/Hepatic Neg liver ROS, PUD,   Endo/Other  diabetes, Type 2, Oral Hypoglycemic Agents  Renal/GU negative Renal ROS     Musculoskeletal negative musculoskeletal ROS (+) Arthritis ,   Abdominal   Peds  Hematology negative hematology ROS (+)   Anesthesia Other Findings   Reproductive/Obstetrics negative OB ROS                             Anesthesia Physical Anesthesia Plan  ASA: II  Anesthesia Plan: Spinal   Post-op Pain Management:    Induction: Intravenous  Airway Management Planned:   Additional Equipment:   Intra-op Plan:   Post-operative Plan: Extubation in OR  Informed Consent: I have reviewed the patients History and Physical, chart, labs and discussed the procedure including the risks, benefits and alternatives for the proposed anesthesia with the patient or authorized representative who has indicated his/her understanding and acceptance.   Dental advisory given  Plan Discussed with: CRNA  Anesthesia Plan Comments:         Anesthesia Quick Evaluation

## 2016-08-09 NOTE — Progress Notes (Signed)
Pt almost able to bend LLE, not quite able to lift hips, tingling in both feet. Dr Ivin Bootyrews fully updated-wants pt to remain in PACU another 30min, then may go to room. Pt not to get OOB tonight and pt verbalizes understanding.

## 2016-08-09 NOTE — Transfer of Care (Signed)
Immediate Anesthesia Transfer of Care Note  Patient: Tamara CraftsWendy M Huisman  Procedure(s) Performed: Procedure(s) with comments: RIGHT TOTAL HIP ARTHROPLASTY ANTERIOR APPROACH (Right) - Needs RNFA  Patient Location: PACU  Anesthesia Type:Spinal  Level of Consciousness: awake, alert , oriented and patient cooperative  Airway & Oxygen Therapy: Patient Spontanous Breathing and Patient connected to nasal cannula oxygen  Post-op Assessment: Report given to RN and Post -op Vital signs reviewed and stable  Post vital signs: Reviewed and stable  Last Vitals:  Vitals:   08/09/16 1028 08/09/16 1530  BP: 138/79 (!) 99/45  Pulse: 89 79  Resp: 20 12  Temp: 36.9 C 36.3 C    Last Pain:  Vitals:   08/09/16 1530  TempSrc:   PainSc: Asleep      Patients Stated Pain Goal: 7 (08/09/16 1104)  Complications: No apparent anesthesia complications

## 2016-08-10 ENCOUNTER — Encounter (HOSPITAL_COMMUNITY): Payer: Self-pay | Admitting: Orthopaedic Surgery

## 2016-08-10 LAB — BASIC METABOLIC PANEL
Anion gap: 11 (ref 5–15)
BUN: 16 mg/dL (ref 6–20)
CALCIUM: 8.9 mg/dL (ref 8.9–10.3)
CHLORIDE: 102 mmol/L (ref 101–111)
CO2: 23 mmol/L (ref 22–32)
CREATININE: 1.17 mg/dL — AB (ref 0.44–1.00)
GFR, EST AFRICAN AMERICAN: 57 mL/min — AB (ref >60)
GFR, EST NON AFRICAN AMERICAN: 49 mL/min — AB (ref >60)
Glucose, Bld: 160 mg/dL — ABNORMAL HIGH (ref 65–99)
Potassium: 4.1 mmol/L (ref 3.5–5.1)
SODIUM: 136 mmol/L (ref 135–145)

## 2016-08-10 LAB — CBC
HCT: 28.5 % — ABNORMAL LOW (ref 36.0–46.0)
HEMOGLOBIN: 9.2 g/dL — AB (ref 12.0–15.0)
MCH: 28.2 pg (ref 26.0–34.0)
MCHC: 32.3 g/dL (ref 30.0–36.0)
MCV: 87.4 fL (ref 78.0–100.0)
PLATELETS: 270 10*3/uL (ref 150–400)
RBC: 3.26 MIL/uL — ABNORMAL LOW (ref 3.87–5.11)
RDW: 13.2 % (ref 11.5–15.5)
WBC: 11.7 10*3/uL — ABNORMAL HIGH (ref 4.0–10.5)

## 2016-08-10 LAB — GLUCOSE, CAPILLARY
GLUCOSE-CAPILLARY: 169 mg/dL — AB (ref 65–99)
GLUCOSE-CAPILLARY: 251 mg/dL — AB (ref 65–99)
GLUCOSE-CAPILLARY: 303 mg/dL — AB (ref 65–99)
GLUCOSE-CAPILLARY: 210 mg/dL — AB (ref 65–99)

## 2016-08-10 NOTE — Evaluation (Signed)
Occupational Therapy Evaluation Patient Details Name: Tamara CraftsWendy M Palmieri MRN: [id number] DOB: 03/02/1956 Today's Date: 08/10/2016    History of Present Illness Pt is a 60 y/o female s/p R THA, anterior approach. PMH including but not limited to DM, HTN, hx of colostomy with reversal (recent, unsure of exact date).   Clinical Impression   PTA, pt utilized RW for ADL and required assistance to dress lower body. Currently, pt requires min guard assist for toilet and shower transfers and min assist for LB ADL. Introduced AE for LB ADL and began education on shower and tub transfers as well as fall prevention in the home and safety with ADL post-operatively. Pt plans to D/C to her boyfriend's home for a few days prior to returning to her apartment. Recommend 24 hour assistance/supervision with no OT follow-up post-acute D/C. Recommend 3 in 1 BSC for DME needs. Pt would benefit from continued OT services to improve independence with ADL and functional mobility with a focus on AE instruction and practice with both tub and shower transfer (boyfriend's home has walk-in shower but pt will need to use tub once in her apartment).     Follow Up Recommendations  No OT follow up;Supervision/Assistance - 24 hour    Equipment Recommendations  3 in 1 bedside comode       Precautions / Restrictions Precautions Precautions: Fall Restrictions Weight Bearing Restrictions: Yes RLE Weight Bearing: Weight bearing as tolerated      Mobility Bed Mobility Overal bed mobility: Needs Assistance Bed Mobility: Sit to Supine;Supine to Sit     Supine to sit: HOB elevated;Supervision Sit to supine: Supervision;HOB elevated   General bed mobility comments: Pt using bed rails with increased time.  Transfers Overall transfer level: Needs assistance Equipment used: Rolling walker (2 wheeled) Transfers: Sit to/from Stand Sit to Stand: Supervision;Min guard         General transfer comment: Min guard for shower and  toilet. VC's to place hands on solid surface for safety.    Balance Overall balance assessment: Needs assistance Sitting-balance support: Feet supported;No upper extremity supported Sitting balance-Leahy Scale: Fair     Standing balance support: Single extremity supported;During functional activity Standing balance-Leahy Scale: Poor Standing balance comment: pt reliant on bilateral UEs on RW                            ADL Overall ADL's : Needs assistance/impaired     Grooming: Supervision/safety;Standing   Upper Body Bathing: Set up;Sitting   Lower Body Bathing: Minimal assistance;Sit to/from stand   Upper Body Dressing : Set up;Sitting   Lower Body Dressing: Minimal assistance;Sit to/from stand   Toilet Transfer: Min guard;Ambulation;BSC;RW   Toileting- Clothing Manipulation and Hygiene: Supervision/safety;Sit to/from stand   Tub/ Shower Transfer: Min guard;Cueing for safety;Cueing for sequencing;3 in 1;Rolling walker   Functional mobility during ADLs: Minimal assistance (for LB ADL) General ADL Comments: Educated pt on use of AE for LB ADL, shower transfer, toilet transfer, and fall precautions.     Vision Vision Assessment?: No apparent visual deficits          Pertinent Vitals/Pain Pain Assessment: 0-10 Pain Score: 6  Pain Location: R hip Pain Descriptors / Indicators: Numbness;Sore;Operative site guarding Pain Intervention(s): Monitored during session;Repositioned     Hand Dominance Right   Extremity/Trunk Assessment Upper Extremity Assessment Upper Extremity Assessment: Overall WFL for tasks assessed   Lower Extremity Assessment Lower Extremity Assessment: RLE deficits/detail RLE Deficits / Details:  Decreased strength and ROM post-operatively.       Communication Communication Communication: No difficulties   Cognition Arousal/Alertness: Awake/alert Behavior During Therapy: WFL for tasks assessed/performed Overall Cognitive Status:  Within Functional Limits for tasks assessed                        Exercises Exercises: Total Joint     Shoulder Instructions      Home Living Family/patient expects to be discharged to:: Private residence Living Arrangements: Other (Comment) (Boyfriend for a few days, alone after) Available Help at Discharge: Family;Friend(s);Available PRN/intermittently Type of Home: House Home Access: Stairs to enter [employer] of Steps: 2 Entrance Stairs-Rails: None Home Layout: One level     Bathroom Shower/Tub: Walk-in shower;Tub/shower unit Shower/tub characteristics: Door Firefighter: Standard     Home Equipment: Environmental consultant - 2 wheels;Wheelchair - manual   Additional Comments: Pt plans to D/C to boyfriend's home who can provide assistance for a few days then plans to move back to her first floor apartment. Reports that apartment bathroom is small and may need to side step with RW.      Prior Functioning/Environment Level of Independence: Needs assistance  Gait / Transfers Assistance Needed: Pt ambulated with RW. ADL's / Homemaking Assistance Needed: Pt required assist to don/doff socks, shoes, and pants.            OT Problem List: Decreased strength;Decreased range of motion;Decreased activity tolerance;Impaired balance (sitting and/or standing);Decreased knowledge of use of DME or AE;Decreased knowledge of precautions;Pain   OT Treatment/Interventions: Self-care/ADL training;DME and/or AE instruction;Energy conservation;Therapeutic activities;Balance training;Patient/family education    OT Goals(Current goals can be found in the care plan section) Acute Rehab OT Goals Patient Stated Goal: return home OT Goal Formulation: With patient Time For Goal Achievement: 08/24/16 Potential to Achieve Goals: Good ADL Goals Pt Will Perform Lower Body Bathing: with adaptive equipment;sit to/from stand;with modified independence Pt Will Perform Lower Body Dressing:  with adaptive equipment;sit to/from stand;with modified independence Pt Will Transfer to Toilet: with modified independence;bedside commode;ambulating Pt Will Perform Tub/Shower Transfer: with modified independence;3 in 1;Shower transfer;Tub transfer;rolling walker Additional ADL Goal #1: Pt will verbalize 3 home modifications for fall prevention to implement upon D/C.  OT Frequency: Min 2X/week    End of Session Equipment Utilized During Treatment: Gait belt;Rolling walker  Activity Tolerance: Patient tolerated treatment well Patient left: in bed;with call bell/phone within reach   Time: 7829-5621 OT Time Calculation (min): 42 min Charges:  OT General Charges $OT Visit: 1 Procedure OT Evaluation $OT Eval Moderate Complexity: 1 Procedure OT Treatments $Self Care/Home Management : 23-37 mins  Doristine Section, OTR/L [phone] 08/10/2016, 4:40 PM

## 2016-08-10 NOTE — Progress Notes (Signed)
Physical Therapy Treatment Patient Details Name: Tamara York MRN: [id number] DOB: 1956/10/08 Today's Date: 08/10/2016    History of Present Illness Pt is a 60 y/o female s/p R THA, anterior approach. PMH including but not limited to DM, HTN, hx of colostomy with reversal (recent, unsure of exact date).    PT Comments    Pt presented sitting OOB in recliner when PT entered room. Pt making great progress towards achieving her functional goals and was able to increase her distance ambulated this session. Pt would continue to benefit from skilled physical therapy services at this time while admitted and after d/c to address her limitations in order to improve her overall safety and independence with functional mobility.   Follow Up Recommendations  Home health PT;Supervision for mobility/OOB     Equipment Recommendations  3in1 (PT)    Recommendations for Other Services       Precautions / Restrictions Precautions Precautions: Fall Restrictions Weight Bearing Restrictions: Yes RLE Weight Bearing: Weight bearing as tolerated    Mobility  Bed Mobility Overal bed mobility: Needs Assistance Bed Mobility: Sit to Supine     Supine to sit: Min assist;HOB elevated Sit to supine: Supervision   General bed mobility comments: pt required increased time and use of bed rails  Transfers Overall transfer level: Needs assistance Equipment used: Rolling walker (2 wheeled) Transfers: Sit to/from Stand Sit to Stand: Min guard Stand pivot transfers: Min guard       General transfer comment: pt required increased time, good hand placement  Ambulation/Gait Ambulation/Gait assistance: Min guard Ambulation Distance (Feet): 100 Feet Assistive device: Rolling walker (2 wheeled) Gait Pattern/deviations: Step-through pattern;Decreased step length - right;Decreased step length - left;Decreased stride length;Decreased weight shift to right;Trunk flexed Gait velocity: decreased Gait velocity  interpretation: Below normal speed for age/gender General Gait Details: pt required VC'ing for forward gaze   Stairs            Wheelchair Mobility    Modified Rankin (Stroke Patients Only)       Balance Overall balance assessment: Needs assistance Sitting-balance support: Feet supported;No upper extremity supported Sitting balance-Leahy Scale: Fair     Standing balance support: During functional activity;Bilateral upper extremity supported Standing balance-Leahy Scale: Poor Standing balance comment: pt reliant on bilateral UEs on RW                    Cognition Arousal/Alertness: Awake/alert Behavior During Therapy: WFL for tasks assessed/performed Overall Cognitive Status: Within Functional Limits for tasks assessed                      Exercises Total Joint Exercises Ankle Circles/Pumps: AROM;Both;10 reps;Seated Quad Sets: AROM;Strengthening;Right;10 reps;Seated Short Arc Quad: AROM;Strengthening;Right;10 reps;Supine Heel Slides: AAROM;Right;10 reps;Supine Hip ABduction/ADduction: AAROM;Right;10 reps;Supine    General Comments        Pertinent Vitals/Pain Pain Assessment: 0-10 Pain Score: 10-Worst pain ever (when performing STS) Pain Location: R LE Pain Descriptors / Indicators: Grimacing;Guarding;Moaning Pain Intervention(s): Monitored during session;Repositioned    Home Living Family/patient expects to be discharged to:: Private residence (boyfriend's home) Living Arrangements: Other (Comment) (boyfriend) Available Help at Discharge: Family;Friend(s);Available PRN/intermittently Type of Home: House Home Access: Stairs to enter Entrance Stairs-Rails: None Home Layout: One level Home Equipment: Environmental consultant - 2 wheels;Wheelchair - manual      Prior Function Level of Independence: Needs assistance  Gait / Transfers Assistance Needed: pt ambulated with use of RW ADL's / Homemaking Assistance Needed: Pt was independent with  all ADL's with  the exception of LB dressing (specifically shoes on R foot and pants). Pt stated that she would drive occasionally but usually rode with her boyfriend.     PT Goals (current goals can now be found in the care plan section) Acute Rehab PT Goals Patient Stated Goal: return home PT Goal Formulation: With patient Time For Goal Achievement: 08/17/16 Potential to Achieve Goals: Good Progress towards PT goals: Progressing toward goals    Frequency    7X/week      PT Plan Current plan remains appropriate    Co-evaluation             End of Session Equipment Utilized During Treatment: Gait belt Activity Tolerance: Patient limited by fatigue;Patient limited by pain Patient left: in bed;with call bell/phone within reach     Time: 1318-1336 PT Time Calculation (min) (ACUTE ONLY): 18 min  Charges:  $Gait Training: 8-22 mins                    G CodesAlessandra York:      Tamara York Tamara York 08/10/2016, 2:07 PM Tamara ChalkJennifer Ahamed York, PT, DPT [phone]

## 2016-08-10 NOTE — Evaluation (Signed)
Physical Therapy Evaluation Patient Details Name: Tamara York MRN: [id number] DOB: 03/18/1956 Today's Date: 08/10/2016   History of Present Illness  Pt is a 60 y/o female s/p R THA, anterior approach. PMH including but not limited to DM, HTN, hx of colostomy with reversal (recent, unsure of exact date).  Clinical Impression  Pt presented supine in bed with HOB elevated, awake and willing to participate in therapy session. Prior to admission, pt stated that she used a RW to ambulate and needed assistance with LB dressing of R LE. Pt limited secondary to pain and fatigue during evaluation; however, she was able to perform SPT from bed to bedside commode with min guard and ambulate ~15' with RW and min guard for safety. Pt would continue to benefit from skilled physical therapy services at this time while admitted and after d/c to address her below listed limitations in order to improve her overall safety and independence with functional mobility.     Follow Up Recommendations Home health PT;Supervision for mobility/OOB    Equipment Recommendations  3in1 (PT)    Recommendations for Other Services       Precautions / Restrictions Precautions Precautions: Fall Restrictions Weight Bearing Restrictions: Yes RLE Weight Bearing: Weight bearing as tolerated      Mobility  Bed Mobility Overal bed mobility: Needs Assistance Bed Mobility: Supine to Sit     Supine to sit: Min assist;HOB elevated     General bed mobility comments: pt required increased time, use of bed rails and min A with R LE movement  Transfers Overall transfer level: Needs assistance Equipment used: Rolling walker (2 wheeled) Transfers: Sit to/from [employer] CorporationStand;Stand Pivot Transfers Sit to Stand: Min guard Stand pivot transfers: Min guard       General transfer comment: pt required increased time and VC'ing for bilateral hand placement; min guard for safety  Ambulation/Gait Ambulation/Gait assistance: Min  guard Ambulation Distance (Feet): 15 Feet Assistive device: Rolling walker (2 wheeled) Gait Pattern/deviations: Step-through pattern;Decreased step length - left;Decreased stance time - right;Decreased weight shift to right Gait velocity: decreased Gait velocity interpretation: Below normal speed for age/gender General Gait Details: pt required VC'ing for sequencing with RW  Stairs            Wheelchair Mobility    Modified Rankin (Stroke Patients Only)       Balance Overall balance assessment: Needs assistance Sitting-balance support: Feet supported;No upper extremity supported Sitting balance-Leahy Scale: Fair     Standing balance support: During functional activity;Bilateral upper extremity supported Standing balance-Leahy Scale: Poor Standing balance comment: pt reliant on bilateral UEs on RW                             Pertinent Vitals/Pain Pain Assessment: 0-10 Pain Score: 6  Pain Location: R LE from hip to ankle, back Pain Descriptors / Indicators: Sore;Grimacing;Guarding Pain Intervention(s): Monitored during session;Repositioned    Home Living Family/patient expects to be discharged to:: Private residence (boyfriend's home) Living Arrangements: Other (Comment) (boyfriend) Available Help at Discharge: Family;Friend(s);Available PRN/intermittently Type of Home: House Home Access: Stairs to enter Entrance Stairs-Rails: None Entrance Stairs-Number of Steps: 2 Home Layout: One level Home Equipment: Walker - 2 wheels;Wheelchair - manual      Prior Function Level of Independence: Needs assistance   Gait / Transfers Assistance Needed: pt ambulated with use of RW  ADL's / Homemaking Assistance Needed: Pt was independent with all ADL's with the exception of LB dressing (  specifically shoes on R foot and pants). Pt stated that she would drive occasionally but usually rode with her boyfriend.        Hand Dominance        Extremity/Trunk  Assessment   Upper Extremity Assessment: Overall WFL for tasks assessed           Lower Extremity Assessment: RLE deficits/detail RLE Deficits / Details: Pt with decreased strength and ROM limitations secondary to post-op.    Cervical / Trunk Assessment: Normal  Communication   Communication: No difficulties  Cognition Arousal/Alertness: Awake/alert Behavior During Therapy: WFL for tasks assessed/performed Overall Cognitive Status: Within Functional Limits for tasks assessed                      General Comments      Exercises Total Joint Exercises Ankle Circles/Pumps: AROM;Both;10 reps;Seated Quad Sets: AROM;Strengthening;Right;10 reps;Seated Hip ABduction/ADduction: AAROM;Right;10 reps;Seated   Assessment/Plan    PT Assessment Patient needs continued PT services  PT Problem List Decreased strength;Decreased range of motion;Decreased activity tolerance;Decreased balance;Decreased mobility;Decreased coordination;Decreased knowledge of use of DME;Pain          PT Treatment Interventions DME instruction;Gait training;Stair training;Functional mobility training;Therapeutic exercise;Therapeutic activities;Balance training;Neuromuscular re-education;Patient/family education    PT Goals (Current goals can be found in the Care Plan section)  Acute Rehab PT Goals Patient Stated Goal: return home PT Goal Formulation: With patient Time For Goal Achievement: 08/17/16 Potential to Achieve Goals: Good    Frequency 7X/week   Barriers to discharge        Co-evaluation               End of Session Equipment Utilized During Treatment: Gait belt Activity Tolerance: Patient limited by fatigue;Patient limited by pain Patient left: in chair;with call bell/phone within reach Nurse Communication: Mobility status         Time: 1000-1021 PT Time Calculation (min) (ACUTE ONLY): 21 min   Charges:   PT Evaluation $PT Eval Moderate Complexity: 1 Procedure      PT G CodesAlessandra Bevels Lian Tanori 08/10/2016, 12:08 PM Deborah Chalk, PT, DPT [phone]

## 2016-08-10 NOTE — Progress Notes (Signed)
   Subjective:  Patient reports pain as moderate.  Severe overnight, better now.  Objective:   VITALS:   Vitals:   08/09/16 2030 08/09/16 2055 08/10/16 0100 08/10/16 0436  BP: (!) 137/97 135/79 133/80 104/71  Pulse: (!) 103 100 99 93  Resp: 15 16 16 16   Temp:  98.1 F (36.7 C) 98.2 F (36.8 C) 98.1 F (36.7 C)  TempSrc:  Oral Oral Oral  SpO2: 100% 100% 100% 100%  Weight:      Height:        Neurologically intact Neurovascular intact Sensation intact distally Intact pulses distally Dorsiflexion/Plantar flexion intact Incision: dressing C/D/I and no drainage No cellulitis present Compartment soft   Lab Results  Component Value Date   WBC 11.7 (H) 08/10/2016   HGB 9.2 (L) 08/10/2016   HCT 28.5 (L) 08/10/2016   MCV 87.4 08/10/2016   PLT 270 08/10/2016     Assessment/Plan:  1 Day Post-Op   - Expected postop acute blood loss anemia - will monitor for symptoms - Up with PT/OT - DVT ppx - SCDs, ambulation, aspirin - WBAT operative extremity - Pain control - Discharge planning - likely home sat with HHPT  Nghia Mcentee Donnelly StagerM Vennie Waymire 08/10/2016, 6:57 AM [phone]

## 2016-08-11 LAB — GLUCOSE, CAPILLARY
GLUCOSE-CAPILLARY: 188 mg/dL — AB (ref 65–99)
GLUCOSE-CAPILLARY: 256 mg/dL — AB (ref 65–99)

## 2016-08-11 LAB — CBC
HCT: 24.6 % — ABNORMAL LOW (ref 36.0–46.0)
HEMOGLOBIN: 8 g/dL — AB (ref 12.0–15.0)
MCH: 28.5 pg (ref 26.0–34.0)
MCHC: 32.5 g/dL (ref 30.0–36.0)
MCV: 87.5 fL (ref 78.0–100.0)
PLATELETS: 315 10*3/uL (ref 150–400)
RBC: 2.81 MIL/uL — ABNORMAL LOW (ref 3.87–5.11)
RDW: 13.5 % (ref 11.5–15.5)
WBC: 11.6 10*3/uL — ABNORMAL HIGH (ref 4.0–10.5)

## 2016-08-11 NOTE — Progress Notes (Signed)
   Subjective:  Patient reports pain as mild  Objective:   VITALS:   Vitals:   08/10/16 0436 08/10/16 1257 08/10/16 1953 08/11/16 0421  BP: 104/71 128/60 123/64 (!) 113/58  Pulse: 93 97 97 85  Resp: 16 16 16 16   Temp: 98.1 F (36.7 C) 98.4 F (36.9 C) 98 F (36.7 C) 98.1 F (36.7 C)  TempSrc: Oral Oral Oral Oral  SpO2: 100% 98% 96% 98%  Weight:      Height:        Neurologically intact Neurovascular intact Sensation intact distally Intact pulses distally Dorsiflexion/Plantar flexion intact Incision: dressing C/D/I and no drainage No cellulitis present Compartment soft   Lab Results  Component Value Date   WBC 11.6 (H) 08/11/2016   HGB 8.0 (L) 08/11/2016   HCT 24.6 (L) 08/11/2016   MCV 87.5 08/11/2016   PLT 315 08/11/2016     Assessment/Plan:  2 Days Post-Op   - home today - cleared PT  Naiping Donnelly StagerM Xu 08/11/2016, 10:36 AM [phone]

## 2016-08-11 NOTE — Discharge Summary (Signed)
Physician Discharge Summary      Patient ID: Tamara York MRN: [id number] DOB/AGE: Aug 01, 1956 60 y.o.  Admit date: 08/09/2016 Discharge date: 08/11/2016  Admission Diagnoses:  <principal problem not specified>  Discharge Diagnoses:  Active Problems:   Osteoarthritis of right hip   Hip joint replacement status   Past Medical History:  Diagnosis Date  . Chronic pain syndrome 10/19/2015  . Depression   . Depression 10/19/2015  . Diabetes (HCC)   . Diabetes mellitus type 2 in obese (HCC) 10/19/2015  . Diverticula of small intestine   . DJD (degenerative joint disease)   . Eczema   . Hx of acute respiratory failure 04/28/2016   post op  . Hyperlipemia   . Hypertension   . OA (osteoarthritis) 10/19/2015  . Pneumonia 04/2015   respitatory failure post op 04/2015  . Ulcerative esophagitis 05/2016    Surgeries: Procedure(s): RIGHT TOTAL HIP ARTHROPLASTY ANTERIOR APPROACH on 08/09/2016   Consultants (if any):   Discharged Condition: Improved  Hospital Course: Tamara York is an 60 y.o. female who was admitted 08/09/2016 with a diagnosis of <principal problem not specified> and went to the operating room on 08/09/2016 and underwent the above named procedures.    She was given perioperative antibiotics:  Anti-infectives    Start     Dose/Rate Route Frequency Ordered Stop   08/09/16 2130  ceFAZolin (ANCEF) IVPB 2g/100 mL premix     2 g 200 mL/hr over 30 Minutes Intravenous Every 6 hours 08/09/16 2101 08/10/16 0553   08/09/16 1047  clindamycin (CLEOCIN) IVPB 900 mg     900 mg 100 mL/hr over 30 Minutes Intravenous On call to O.R. 08/09/16 1047 08/09/16 1250    .  She was given sequential compression devices, early ambulation, and aspirin for DVT prophylaxis.  She benefited maximally from the hospital stay and there were no complications.    Recent vital signs:  Vitals:   08/10/16 1953 08/11/16 0421  BP: 123/64 (!) 113/58  Pulse: 97 85  Resp: 16 16  Temp: 98 F  (36.7 C) 98.1 F (36.7 C)    Recent laboratory studies:  Lab Results  Component Value Date   HGB 8.0 (L) 08/11/2016   HGB 9.2 (L) 08/10/2016   HGB 11.3 (L) 08/09/2016   Lab Results  Component Value Date   WBC 11.6 (H) 08/11/2016   PLT 315 08/11/2016   Lab Results  Component Value Date   INR 0.90 08/09/2016   Lab Results  Component Value Date   NA 136 08/10/2016   K 4.1 08/10/2016   CL 102 08/10/2016   CO2 23 08/10/2016   BUN 16 08/10/2016   CREATININE 1.17 (H) 08/10/2016   GLUCOSE 160 (H) 08/10/2016    Discharge Medications:     Medication List    TAKE these medications   amitriptyline 50 MG tablet Commonly known as:  ELAVIL Take 50 mg by mouth at bedtime.   aspirin EC 325 MG tablet Take 1 tablet (325 mg total) by mouth 2 (two) times daily.   buPROPion 150 MG 24 hr tablet Commonly known as:  WELLBUTRIN XL Take 150 mg by mouth daily.   GOODYS BODY PAIN PO Take 1 packet by mouth daily as needed (headache).   HYDROcodone-acetaminophen 5-325 MG tablet Commonly known as:  NORCO/VICODIN Take 1 tablet by mouth every 6 (six) hours as needed for moderate pain.   metFORMIN 500 MG 24 hr tablet Commonly known as:  GLUCOPHAGE-XR Take 500 mg by mouth  daily with breakfast.   methocarbamol 750 MG tablet Commonly known as:  ROBAXIN Take 1 tablet (750 mg total) by mouth 2 (two) times daily as needed for muscle spasms.   ondansetron 4 MG tablet Commonly known as:  ZOFRAN Take 1-2 tablets (4-8 mg total) by mouth every 8 (eight) hours as needed for nausea or vomiting.   oxyCODONE 5 MG immediate release tablet Commonly known as:  Oxy IR/ROXICODONE Take 1-3 tablets (5-15 mg total) by mouth every 4 (four) hours as needed.   pantoprazole 40 MG tablet Commonly known as:  PROTONIX Take 40 mg by mouth daily as needed (ulcers).   pravastatin 20 MG tablet Commonly known as:  PRAVACHOL Take 20 mg by mouth daily.   senna-docusate 8.6-50 MG tablet Commonly known as:   SENOKOT S Take 1 tablet by mouth at bedtime as needed.   traMADol 50 MG tablet Commonly known as:  ULTRAM Take 2 tablets (100 mg total) by mouth every 6 (six) hours as needed. What changed:  reasons to take this   triamterene-hydrochlorothiazide 37.5-25 MG tablet Commonly known as:  MAXZIDE-25 Take 1 tablet by mouth 2 (two) times daily.   zolpidem 10 MG tablet Commonly known as:  AMBIEN Take 10 mg by mouth at bedtime.       Diagnostic Studies: Dg Pelvis Portable  Result Date: 08/09/2016 CLINICAL DATA:  Hip replacement EXAM: PORTABLE PELVIS 1-2 VIEWS COMPARISON:  None. FINDINGS: A portable view of the pelvis shows right hip replacement to be present. The acetabular and femoral compartments are in good position on the image obtained with no complicating features. IMPRESSION: Right total hip replacement in good position. No complicating features. Electronically Signed   By: Dwyane DeePaul  Barry M.D.   On: 08/09/2016 15:59   Dg C-arm 61-120 Min  Result Date: 08/09/2016 CLINICAL DATA:  Patient status post total right hip arthroplasty. EXAM: OPERATIVE RIGHT HIP (WITH PELVIS IF PERFORMED) 4 VIEWS TECHNIQUE: Fluoroscopic spot image(s) were submitted for interpretation post-operatively. COMPARISON:  None. FINDINGS: Patient status post right hip arthroplasty. Intraoperative fluoroscopic images submitted for interpretation. Hardware appears intact. No acute osseous abnormality. IMPRESSION: Patient status post right hip arthroplasty. Electronically Signed   By: Annia Beltrew  Davis M.D.   On: 08/09/2016 15:31   Dg Hip Operative Unilat With Pelvis Right  Result Date: 08/09/2016 CLINICAL DATA:  Patient status post total right hip arthroplasty. EXAM: OPERATIVE RIGHT HIP (WITH PELVIS IF PERFORMED) 4 VIEWS TECHNIQUE: Fluoroscopic spot image(s) were submitted for interpretation post-operatively. COMPARISON:  None. FINDINGS: Patient status post right hip arthroplasty. Intraoperative fluoroscopic images submitted for  interpretation. Hardware appears intact. No acute osseous abnormality. IMPRESSION: Patient status post right hip arthroplasty. Electronically Signed   By: Annia Beltrew  Davis M.D.   On: 08/09/2016 15:31    Disposition: 01-Home or Self Care  Discharge Instructions    Call MD / Call 911    Complete by:  As directed    If you experience chest pain or shortness of breath, CALL 911 and be transported to the hospital emergency room.  If you develope a fever above 101.5 F, pus (white drainage) or increased drainage or redness at the wound, or calf pain, call your surgeon's office.   Constipation Prevention    Complete by:  As directed    Drink plenty of fluids.  Prune juice may be helpful.  You may use a stool softener, such as Colace (over the counter) 100 mg twice a day.  Use MiraLax (over the counter) for constipation as needed.  Diet - low sodium heart healthy    Complete by:  As directed    Diet general    Complete by:  As directed    Driving restrictions    Complete by:  As directed    No driving while taking narcotic pain meds.   Increase activity slowly as tolerated    Complete by:  As directed       Follow-up Information    Lajoya Dombek Donnelly Stager, MD In 2 weeks.   Specialty:  Orthopedic Surgery Why:  For suture removal, For wound re-check Contact information: [address] [place] [address] [phone]            Signed: Tarry Kos 08/11/2016, 10:37 AM

## 2016-08-11 NOTE — Care Management Note (Signed)
Case Management Note  Patient Details  Name: Tamara York MRN: [id number] Date of Birth: 12/23/1955  Subjective/Objective:                  RIGHT TOTAL HIP ARTHROPLASTY ANTERIOR APPROACH (Right) Action/Plan: Discharge planning Expected Discharge Date:  08/11/16               Expected Discharge Plan:  Home w Home Health Services  In-House Referral:     Discharge planning Services  CM Consult  Post Acute Care Choice:    Choice offered to:  Patient  DME Arranged:  3-N-1 DME Agency:  [facility]  [facility]  [facility]  Status of Service:  Completed, signed off  If discussed at Long Length of Stay Meetings, dates discussed:    Additional Comments: CM spoke with pt to offer choice of home health agency to render HHPT.  Pt declines all HH services and states she will be fine at her boyfriend's home and does not wish to have any home services.  Pt states she has rolling walker and reacher and only needs a bedside commode.  CM notified [facility] DME rep, Reggie to please deliver the 3n1 to room so pt can discharge.  No other CM needs were communicated. Yves DillJeffries, Emili Mcloughlin Christine, RN 08/11/2016, 11:01 AM

## 2016-08-11 NOTE — Progress Notes (Signed)
Physical Therapy Treatment Patient Details Name: Tamara York MRN: [id number] DOB: 03/04/1956 Today's Date: 08/11/2016    History of Present Illness Pt is a 60 y/o female s/p R THA, anterior approach. PMH including but not limited to DM, HTN, hx of colostomy with reversal (recent, unsure of exact date).    PT Comments    Patient is progressing well toward mobility goals. Pt tolerated increase in gait distance and with improved WB on R LE as well as stair training and therex. Continue to progress as tolerated with anticipated d/c home with HHPT.   Follow Up Recommendations  Home health PT;Supervision for mobility/OOB     Equipment Recommendations  3in1 (PT)    Recommendations for Other Services       Precautions / Restrictions Precautions Precautions: Fall Restrictions Weight Bearing Restrictions: Yes RLE Weight Bearing: Weight bearing as tolerated    Mobility  Bed Mobility Overal bed mobility: Needs Assistance Bed Mobility: Sit to Supine;Supine to Sit     Supine to sit: Supervision Sit to supine: Supervision   General bed mobility comments: increased time and effort; no rails; HOB flat; supervision for safety  Transfers Overall transfer level: Needs assistance Equipment used: Rolling walker (2 wheeled) Transfers: Sit to/from Stand Sit to Stand: Min guard         General transfer comment: cues for safe hand placement with carry over demonstrated  Ambulation/Gait Ambulation/Gait assistance: Supervision Ambulation Distance (Feet): 200 Feet Assistive device: Rolling walker (2 wheeled) Gait Pattern/deviations: Step-through pattern;Decreased stance time - right;Decreased step length - left;Decreased weight shift to right Gait velocity: decreased   General Gait Details: cues for increased WB on R LE and worked on symmetrical step lengths   Information systems managertairs            Wheelchair Mobility    Modified Rankin (Stroke Patients Only)       Balance     Sitting  balance-Leahy Scale: Good       Standing balance-Leahy Scale: Fair                      Cognition Arousal/Alertness: Awake/alert Behavior During Therapy: WFL for tasks assessed/performed Overall Cognitive Status: Within Functional Limits for tasks assessed                      Exercises Total Joint Exercises Quad Sets: AROM;Both;10 reps;Supine Gluteal Sets: AROM;Both;10 reps;Supine Heel Slides: Right;10 reps;Supine;AROM Hip ABduction/ADduction: Right;10 reps;Supine;AROM;Standing    General Comments        Pertinent Vitals/Pain Pain Assessment: Faces Faces Pain Scale: Hurts little more Pain Location: R hip Pain Descriptors / Indicators: Burning;Sore;Guarding Pain Intervention(s): Limited activity within patient's tolerance;Monitored during session;Premedicated before session;Repositioned    Home Living                      Prior Function            PT Goals (current goals can now be found in the care plan section) Acute Rehab PT Goals Patient Stated Goal: return home Progress towards PT goals: Progressing toward goals    Frequency    7X/week      PT Plan Current plan remains appropriate    Co-evaluation             End of Session Equipment Utilized During Treatment: Gait belt Activity Tolerance: Patient tolerated treatment well Patient left: in bed;with call bell/phone within reach     Time: 0925-1001 PT Time  Calculation (min) (ACUTE ONLY): 36 min  Charges:  $Gait Training: 8-22 mins $Therapeutic Exercise: 8-22 mins                    G Codes:      Derek Mound, PTA Pager: [phone]   08/11/2016, 10:07 AM

## 2016-08-27 ENCOUNTER — Ambulatory Visit (INDEPENDENT_AMBULATORY_CARE_PROVIDER_SITE_OTHER): Payer: Medicaid Other | Admitting: Orthopaedic Surgery

## 2016-08-27 ENCOUNTER — Ambulatory Visit (INDEPENDENT_AMBULATORY_CARE_PROVIDER_SITE_OTHER): Payer: Medicaid Other

## 2016-08-27 DIAGNOSIS — M1611 Unilateral primary osteoarthritis, right hip: Secondary | ICD-10-CM

## 2016-08-27 DIAGNOSIS — Z96641 Presence of right artificial hip joint: Secondary | ICD-10-CM

## 2016-08-27 NOTE — Progress Notes (Signed)
Office Visit Note   Patient: Tamara York           Date of Birth: Mar 18, 1956           MRN: [id number] Visit Date: 08/27/2016              Requested by: No referring provider defined for this encounter. PCP: Lasandra Beech, MD   Assessment & Plan: Visit Diagnoses:  1. Unilateral primary osteoarthritis, right hip   2. Status post right hip replacement     Plan:  2 week THA follow up plan  Patient presents for 2 week follow up after total hip replacement.  The incision is clean, dry, and intact and healing very well. There is no drainage, erythema, or signs of infection. Motion is progressing nicely.  We have encouraged continued use of TED hose as well as aspirin for DVT prophylaxis, and to continue with physical therapy exercises to work on strength and endurance. Patient is progressing well. Reminders were given about signs to be aware of including redness, drainage, increased pain, fevers, calf pain, shortness of breath, or any concern should generate a phone call or a return to see [place] immediately. Will plan to follow up at 6 weeks post op for next evaluation with radiographs at that time including low AP pelvis, AP and lateral radiographs.   Follow-Up Instructions: Return in about 4 weeks (around 09/24/2016) for 6 week postop THA  visit, standing pelvis on return.   Orders:  Orders Placed This Encounter  Procedures  . XR HIP UNILAT W OR W/O PELVIS 2-3 VIEWS LEFT   No orders of the defined types were placed in this encounter.     Procedures: No procedures performed   Clinical Data: No additional findings.   Subjective: Chief Complaint  Patient presents with  . Right Hip - Routine Post Op, Wound Check    Doing well. No complaints.  Doing HHPT.   Wound Check     Review of Systems   Objective: Vital Signs: There were no vitals taken for this visit.  Physical Exam  Right Hip Exam  Right hip exam is normal.   Comments:  Expected postop  swelling Incision healed      Specialty Comments:  No specialty comments available.  Imaging: No results found.   PMFS History: Patient Active Problem List   Diagnosis Date Noted  . Osteoarthritis of right hip 08/09/2016  . Hip joint replacement status 08/09/2016  . OA (osteoarthritis) 10/19/2015  . Depression 10/19/2015  . Diabetes mellitus type 2 in obese (HCC) 10/19/2015  . Hammer toe 10/19/2015  . Chronic pain syndrome 10/19/2015   Past Medical History:  Diagnosis Date  . Chronic pain syndrome 10/19/2015  . Depression   . Depression 10/19/2015  . Diabetes (HCC)   . Diabetes mellitus type 2 in obese (HCC) 10/19/2015  . Diverticula of small intestine   . DJD (degenerative joint disease)   . Eczema   . Hx of acute respiratory failure 04/28/2016   post op  . Hyperlipemia   . Hypertension   . OA (osteoarthritis) 10/19/2015  . Pneumonia 04/2015   respitatory failure post op 04/2015  . Ulcerative esophagitis 05/2016    Family History  Problem Relation Age of Onset  . Heart failure Mother   . Heart disease Father     Past Surgical History:  Procedure Laterality Date  . CESAREAN SECTION  1983, 1987  . COLONOSCOPY    . COLOSTOMY  04/25/2015  .  COLOSTOMY CLOSURE  04/26/2016  . INCISIONAL HERNIA REPAIR  04/26/2016  . STOMAPLASTY  04/2015  . TOTAL HIP ARTHROPLASTY Right 08/09/2016   Procedure: RIGHT TOTAL HIP ARTHROPLASTY ANTERIOR APPROACH;  Surgeon: Tarry Kos, MD;  Location: [facility] OR;  Service: Orthopedics;  Laterality: Right;  Needs RNFA  . UPPER GI ENDOSCOPY     Social History   Occupational History  . Not on file.   Social History Main Topics  . Smoking status: Former Smoker    Packs/day: 0.50    Years: 40.00    Types: Cigarettes    Quit date: 04/26/2016  . Smokeless tobacco: Never Used  . Alcohol use No  . Drug use: No  . Sexual activity: Not on file

## 2016-09-04 ENCOUNTER — Telehealth (INDEPENDENT_AMBULATORY_CARE_PROVIDER_SITE_OTHER): Payer: Self-pay | Admitting: *Deleted

## 2016-09-04 NOTE — Telephone Encounter (Signed)
Pt. Called stating she needed a refill of oxycodone 03-07-24. Call back number is [phone]. Pt. Stated she was taking goody powders but those have caused ulcers and cannot take this anymore.

## 2016-09-05 NOTE — Telephone Encounter (Signed)
Please advise 

## 2016-09-05 NOTE — Telephone Encounter (Signed)
Yes refill #30.  1 tab po bid prn pain

## 2016-09-06 MED ORDER — OXYCODONE-ACETAMINOPHEN 5-325 MG PO TABS: ORAL_TABLET | ORAL | 0 refills | Status: AC

## 2016-09-06 NOTE — Telephone Encounter (Signed)
Left message on machine to let her know rx ready for pick up at the front desk.

## 2016-09-06 NOTE — Telephone Encounter (Signed)
rx printed

## 2016-09-24 ENCOUNTER — Ambulatory Visit (INDEPENDENT_AMBULATORY_CARE_PROVIDER_SITE_OTHER): Payer: Medicaid Other

## 2016-09-24 ENCOUNTER — Ambulatory Visit (INDEPENDENT_AMBULATORY_CARE_PROVIDER_SITE_OTHER): Payer: Medicaid Other | Admitting: Orthopaedic Surgery

## 2016-09-24 ENCOUNTER — Encounter (INDEPENDENT_AMBULATORY_CARE_PROVIDER_SITE_OTHER): Payer: Self-pay | Admitting: Orthopaedic Surgery

## 2016-09-24 DIAGNOSIS — Z96641 Presence of right artificial hip joint: Secondary | ICD-10-CM

## 2016-09-24 DIAGNOSIS — M1611 Unilateral primary osteoarthritis, right hip: Secondary | ICD-10-CM

## 2016-09-24 MED ORDER — TRAMADOL HCL 50 MG PO TABS: 50.0000 mg | ORAL_TABLET | Freq: Two times a day (BID) | ORAL | 3 refills | Status: AC | PRN

## 2016-09-24 NOTE — Progress Notes (Signed)
Office Visit Note   Patient: Tamara York           Date of Birth: 01/27/1956           MRN: [id number] Visit Date: 09/24/2016              Requested by: Lasandra Beechathy Judge Swaney, MD [address] [place], [place] [id number] PCP: Lasandra BeechSWANEY,CATHY JUDGE, MD   Assessment & Plan: Visit Diagnoses:  1. Primary osteoarthritis of right hip   2. Status post right hip replacement     Plan:  6 week THA follow up plan  Patient presents for follow up 6 weeks status post total hip replacement. The wound is healed and there is no evidence of infection. TED hose may be discontinued. Radiographs reveal a total hip arthroplasty in good position, with no evidence of subsidence, loosening, or complicating features. It was reinforced that with any procedure including dental work, colonoscopy, or any invasive procedure that pre-procedural prophylactic antibiotics must be taken to decrease the risk of infection. Reminders were given about signs to be aware of including redness, drainage, increased pain, fevers, calf pain, shortness of breath, or any concern should generate a phone call or a return to see us immediately. Will plan to follow up at 3 months postoperatively.  Tramadol refilled today.   Follow-Up Instructions: Return in about 2 months (around 11/24/2016).   Orders:  Orders Placed This Encounter  Procedures  . XR HIP UNILAT W OR W/O PELVIS 2-3 VIEWS RIGHT   Meds ordered this encounter  Medications  . traMADol (ULTRAM) 50 MG tablet    Sig: Take 1 tablet (50 mg total) by mouth every 12 (twelve) hours as needed.    Dispense:  30 tablet    Refill:  3      Procedures: No procedures performed   Clinical Data: No additional findings.   Subjective: Chief Complaint  Patient presents with  . Right Hip - Pain    6 week postop visit.  Doing well.  No complaints.  Ambulating with cane.  Finished PT and now doing HEP.  Off narcotic pain meds now.      Review of  Systems   Objective: Vital Signs: There were no vitals taken for this visit.  Physical Exam  Right Hip Exam   Comments:  Healed scar.  Good ROM of hip      Specialty Comments:  No specialty comments available.  Imaging: Xr Hip Unilat W Or W/o Pelvis 2-3 Views Right  Result Date: 09/24/2016 Stable right THA    PMFS History: Patient Active Problem List   Diagnosis Date Noted  . Osteoarthritis of right hip 08/09/2016  . Hip joint replacement status 08/09/2016  . OA (osteoarthritis) 10/19/2015  . Depression 10/19/2015  . Diabetes mellitus type 2 in obese (HCC) 10/19/2015  . Hammer toe 10/19/2015  . Chronic pain syndrome 10/19/2015   Past Medical History:  Diagnosis Date  . Chronic pain syndrome 10/19/2015  . Depression   . Depression 10/19/2015  . Diabetes (HCC)   . Diabetes mellitus type 2 in obese (HCC) 10/19/2015  . Diverticula of small intestine   . DJD (degenerative joint disease)   . Eczema   . Hx of acute respiratory failure 04/28/2016   post op  . Hyperlipemia   . Hypertension   . OA (osteoarthritis) 10/19/2015  . Pneumonia 04/2015   respitatory failure post op 04/2015  . Ulcerative esophagitis 05/2016    Family History  Problem  Relation Age of Onset  . Heart failure Mother   . Heart disease Father     Past Surgical History:  Procedure Laterality Date  . CESAREAN SECTION  1983, 1987  . COLONOSCOPY    . COLOSTOMY  04/25/2015  . COLOSTOMY CLOSURE  04/26/2016  . INCISIONAL HERNIA REPAIR  04/26/2016  . STOMAPLASTY  04/2015  . TOTAL HIP ARTHROPLASTY Right 08/09/2016   Procedure: RIGHT TOTAL HIP ARTHROPLASTY ANTERIOR APPROACH;  Surgeon: Tarry KosNaiping M Jarelly Rinck, MD;  Location: [facility] OR;  Service: Orthopedics;  Laterality: Right;  Needs RNFA  . UPPER GI ENDOSCOPY     Social History   Occupational History  . Not on file.   Social History Main Topics  . Smoking status: Former Smoker    Packs/day: 0.50    Years: 40.00    Types: Cigarettes    Quit date:  04/26/2016  . Smokeless tobacco: Never Used  . Alcohol use No  . Drug use: No  . Sexual activity: Not on file

## 2016-11-26 ENCOUNTER — Ambulatory Visit (INDEPENDENT_AMBULATORY_CARE_PROVIDER_SITE_OTHER): Payer: Medicaid Other | Admitting: Orthopaedic Surgery

## 2016-11-26 ENCOUNTER — Encounter (INDEPENDENT_AMBULATORY_CARE_PROVIDER_SITE_OTHER): Payer: Self-pay | Admitting: Orthopaedic Surgery

## 2016-11-26 DIAGNOSIS — Z96641 Presence of right artificial hip joint: Secondary | ICD-10-CM

## 2016-11-26 MED ORDER — TRAMADOL HCL 50 MG PO TABS: 50.0000 mg | ORAL_TABLET | Freq: Two times a day (BID) | ORAL | 2 refills | Status: DC | PRN

## 2016-11-26 NOTE — Progress Notes (Signed)
Office Visit Note   Patient: Tamara York           Date of Birth: [birth year]           MRN: [id number] Visit Date: 11/26/2016              Requested by: Lasandra Beech, MD [address] [place], [place] [address] PCP: Lasandra Beech, MD   Assessment & Plan: Visit Diagnoses:  1. Status post right hip replacement     Plan: Patient is doing well for her 3 month month postop visit. Reminded her of her dental prophylaxis. I'll see her back in 9 months with standing AP pelvis. Questions encouraged and answered  Follow-Up Instructions: Return in about 9 months (around 08/26/2017).   Orders:  No orders of the defined types were placed in this encounter.  Meds ordered this encounter  Medications  . traMADol (ULTRAM) 50 MG tablet    Sig: Take 1 tablet (50 mg total) by mouth 2 (two) times daily as needed.    Dispense:  30 tablet    Refill:  2      Procedures: No procedures performed   Clinical Data: No additional findings.   Subjective: Chief Complaint  Patient presents with  . Right Hip - Pain, Follow-up    Patient is a little over 3 months status post right total hip replacement she is doing excellent. denies any groin pain. Is very satisfied with the surgery. Endorses some burning in the thigh.    Review of Systems   Objective: Vital Signs: There were no vitals taken for this visit.  Physical Exam  Ortho Exam Exam of the right hip is benign. We'll healed surgical scar. Specialty Comments:  No specialty comments available.  Imaging: No results found.   PMFS History: Patient Active Problem List   Diagnosis Date Noted  . Osteoarthritis of right hip 08/09/2016  . Hip joint replacement status 08/09/2016  . OA (osteoarthritis) 10/19/2015  . Depression 10/19/2015  . Diabetes mellitus type 2 in obese (HCC) 10/19/2015  . Hammer toe 10/19/2015  . Chronic pain syndrome 10/19/2015   Past Medical History:  Diagnosis Date  . Chronic pain  syndrome 10/19/2015  . Depression   . Depression 10/19/2015  . Diabetes (HCC)   . Diabetes mellitus type 2 in obese (HCC) 10/19/2015  . Diverticula of small intestine   . DJD (degenerative joint disease)   . Eczema   . Hx of acute respiratory failure 04/28/2016   post op  . Hyperlipemia   . Hypertension   . OA (osteoarthritis) 10/19/2015  . Pneumonia 04/2015   respitatory failure post op 04/2015  . Ulcerative esophagitis 05/2016    Family History  Problem Relation Age of Onset  . Heart failure Mother   . Heart disease Father     Past Surgical History:  Procedure Laterality Date  . CESAREAN SECTION  1983, 1987  . COLONOSCOPY    . COLOSTOMY  04/25/2015  . COLOSTOMY CLOSURE  04/26/2016  . INCISIONAL HERNIA REPAIR  04/26/2016  . STOMAPLASTY  04/2015  . TOTAL HIP ARTHROPLASTY Right 08/09/2016   Procedure: RIGHT TOTAL HIP ARTHROPLASTY ANTERIOR APPROACH;  Surgeon: Tarry Kos, MD;  Location: [facility] OR;  Service: Orthopedics;  Laterality: Right;  Needs RNFA  . UPPER GI ENDOSCOPY     Social History   Occupational History  . Not on file.   Social History Main Topics  . Smoking status: Former Smoker  Packs/day: 0.50    Years: 40.00    Types: Cigarettes    Quit date: 04/26/2016  . Smokeless tobacco: Never Used  . Alcohol use No  . Drug use: No  . Sexual activity: Not on file

## 2017-08-27 ENCOUNTER — Ambulatory Visit (INDEPENDENT_AMBULATORY_CARE_PROVIDER_SITE_OTHER): Payer: Medicaid Other | Admitting: Orthopaedic Surgery

## 2017-09-09 ENCOUNTER — Encounter (INDEPENDENT_AMBULATORY_CARE_PROVIDER_SITE_OTHER): Payer: Self-pay | Admitting: Orthopaedic Surgery

## 2017-09-09 ENCOUNTER — Ambulatory Visit (INDEPENDENT_AMBULATORY_CARE_PROVIDER_SITE_OTHER): Payer: Self-pay | Admitting: Orthopaedic Surgery

## 2017-09-09 ENCOUNTER — Ambulatory Visit (INDEPENDENT_AMBULATORY_CARE_PROVIDER_SITE_OTHER): Payer: Self-pay

## 2017-09-09 DIAGNOSIS — M1611 Unilateral primary osteoarthritis, right hip: Secondary | ICD-10-CM

## 2017-09-09 MED ORDER — DICLOFENAC SODIUM 75 MG PO TBEC: 75.0000 mg | DELAYED_RELEASE_TABLET | Freq: Two times a day (BID) | ORAL | 2 refills | Status: DC

## 2017-09-09 NOTE — Progress Notes (Signed)
Office Visit Note   Patient: Tamara York           Date of Birth: 03/11/1956           MRN: [id number] Visit Date: 09/09/2017              Requested by: Lasandra BeechSwaney, Cathy Judge, MD [address] [place], [place] [id number] PCP: Lasandra BeechSwaney, Cathy Judge, MD   Assessment & Plan: Visit Diagnoses:  1. Primary osteoarthritis of right hip     Plan: Patient has 1 year status post right total hip replacement and doing well.  I reminded her of dental prophylaxis.  Continue activity as tolerated.  Prescription for diclofenac as needed.  Follow-up in 1 year with AP pelvis and lateral right hip x-ray  Follow-Up Instructions: Return in about 1 year (around 09/09/2018).   Orders:  Orders Placed This Encounter  Procedures  . XR HIP UNILAT W OR W/O PELVIS 2-3 VIEWS RIGHT   Meds ordered this encounter  Medications  . diclofenac (VOLTAREN) 75 MG EC tablet    Sig: Take 1 tablet (75 mg total) 2 (two) times daily by mouth.    Dispense:  30 tablet    Refill:  2      Procedures: No procedures performed   Clinical Data: No additional findings.   Subjective: Chief Complaint  Patient presents with  . Right Hip - Pain, Follow-up    [name] is 13 months status post right total hip replacement.  She is overall doing well.  She has been very active and possibly overdid it.  She feels some discomfort in her right hip that is worse with activity.  She endorses some numbness and tingling but overall she is significantly better since the surgery.    Review of Systems   Objective: Vital Signs: There were no vitals taken for this visit.  Physical Exam  Ortho Exam Right hip exam shows a fully healed surgical scar.  Leg lengths are equal.  Painless rotation of the hip. Specialty Comments:  No specialty comments available.  Imaging: Xr Hip Unilat W Or W/o Pelvis 2-3 Views Right  Result Date: 09/09/2017 Stable right total hip replacement in good alignment.  There is a slight lucency around  the shoulder of the stem.    PMFS History: Patient Active Problem List   Diagnosis Date Noted  . Osteoarthritis of right hip 08/09/2016  . Hip joint replacement status 08/09/2016  . OA (osteoarthritis) 10/19/2015  . Depression 10/19/2015  . Diabetes mellitus type 2 in obese (HCC) 10/19/2015  . Hammer toe 10/19/2015  . Chronic pain syndrome 10/19/2015   Past Medical History:  Diagnosis Date  . Chronic pain syndrome 10/19/2015  . Depression   . Depression 10/19/2015  . Diabetes (HCC)   . Diabetes mellitus type 2 in obese (HCC) 10/19/2015  . Diverticula of small intestine   . DJD (degenerative joint disease)   . Eczema   . Hx of acute respiratory failure 04/28/2016   post op  . Hyperlipemia   . Hypertension   . OA (osteoarthritis) 10/19/2015  . Pneumonia 04/2015   respitatory failure post op 04/2015  . Ulcerative esophagitis 05/2016    Family History  Problem Relation Age of Onset  . Heart failure Mother   . Heart disease Father     Past Surgical History:  Procedure Laterality Date  . CESAREAN SECTION  1983, 1987  . COLONOSCOPY    . COLOSTOMY  04/25/2015  . COLOSTOMY CLOSURE  04/26/2016  . INCISIONAL HERNIA REPAIR  04/26/2016  . STOMAPLASTY  04/2015  . UPPER GI ENDOSCOPY     Social History   Occupational History  . Not on file  Tobacco Use  . Smoking status: Former Smoker    Packs/day: 0.50    Years: 40.00    Pack years: 20.00    Types: Cigarettes    Last attempt to quit: 04/26/2016    Years since quitting: 1.3  . Smokeless tobacco: Never Used  Substance and Sexual Activity  . Alcohol use: No    Alcohol/week: 0.0 oz  . Drug use: No  . Sexual activity: Not on file

## 2018-02-06 ENCOUNTER — Telehealth (INDEPENDENT_AMBULATORY_CARE_PROVIDER_SITE_OTHER): Payer: Self-pay | Admitting: Orthopaedic Surgery

## 2018-02-06 NOTE — Telephone Encounter (Signed)
Please advise 

## 2018-02-06 NOTE — Telephone Encounter (Signed)
Yes for both.  #30

## 2018-02-06 NOTE — Telephone Encounter (Signed)
Patient is wanting rx for tramadol and anti inflammatory (diclofenac). She said the pharmacy sent request for the anti inflammatory but she wanted to see if Dr. Roda ShuttersXu  Would call in the tramadol because she is in pain when she tries to walk and exercise and said she needs this to get her weight off.

## 2018-02-07 ENCOUNTER — Other Ambulatory Visit (INDEPENDENT_AMBULATORY_CARE_PROVIDER_SITE_OTHER): Payer: Self-pay

## 2018-02-07 MED ORDER — DICLOFENAC SODIUM 75 MG PO TBEC: 75.0000 mg | DELAYED_RELEASE_TABLET | Freq: Two times a day (BID) | ORAL | 0 refills | Status: AC

## 2018-02-07 MED ORDER — TRAMADOL HCL 50 MG PO TABS: 50.0000 mg | ORAL_TABLET | Freq: Two times a day (BID) | ORAL | 0 refills | Status: AC | PRN

## 2018-02-07 NOTE — Telephone Encounter (Signed)
Called Rx into [facility]

## 2018-09-09 ENCOUNTER — Ambulatory Visit (INDEPENDENT_AMBULATORY_CARE_PROVIDER_SITE_OTHER): Payer: Self-pay | Admitting: Orthopaedic Surgery

## 2018-09-27 IMAGING — CR DG PORTABLE PELVIS
1 series · 1 of 1 positions shown · non-contrast
Comparison: None.

CLINICAL DATA: Hip replacement

EXAM:
PORTABLE PELVIS 1-2 VIEWS

[AP]
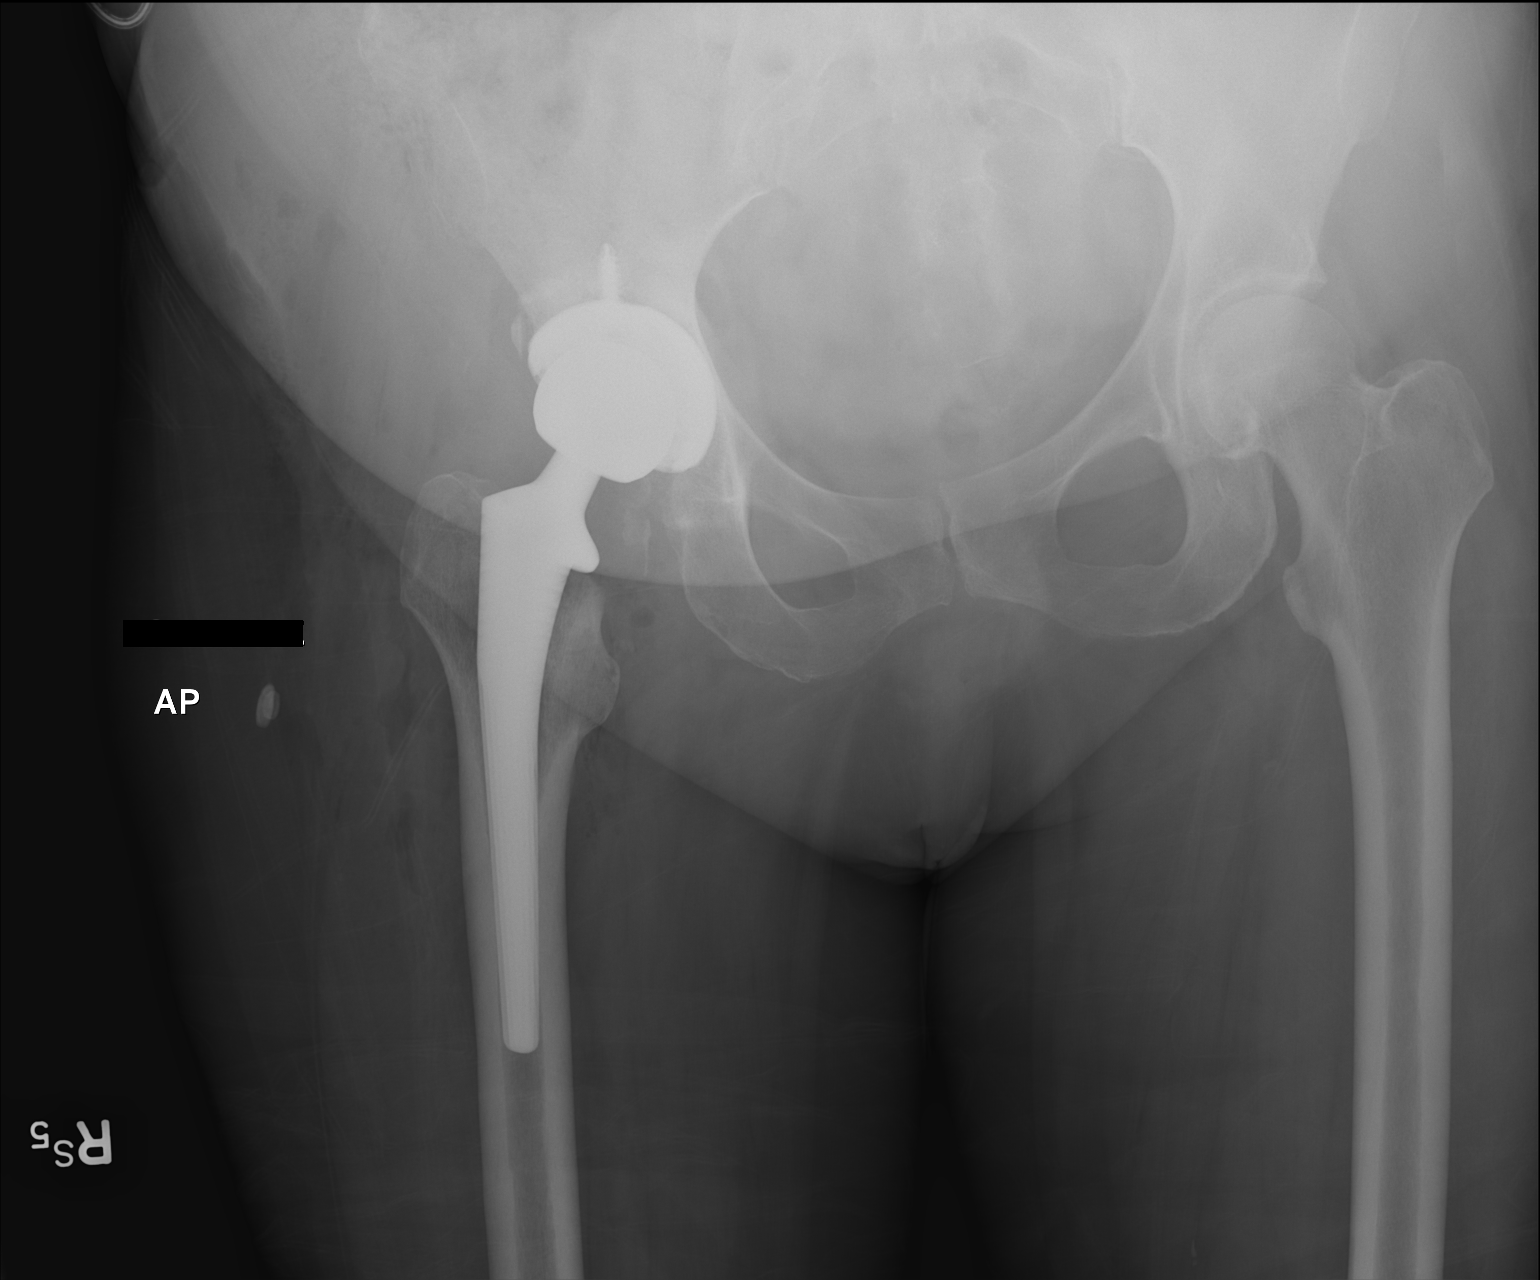

[1 of 1 positions shown; findings below may reference images not displayed]

FINDINGS: A portable view of the pelvis shows right hip replacement to be
present. The acetabular and femoral compartments are in good
position on the image obtained with no complicating features.
IMPRESSION: Right total hip replacement in good position. No complicating
features.

## 2018-09-27 IMAGING — RF DG HIP (WITH PELVIS) OPERATIVE*R*
1 series · 5 of 5 positions shown · non-contrast
Comparison: None.

CLINICAL DATA: Patient status post total right hip arthroplasty.

EXAM:
OPERATIVE RIGHT HIP (WITH PELVIS IF PERFORMED) 4 VIEWS
TECHNIQUE: Fluoroscopic spot image(s) were submitted for interpretation
post-operatively.

[Series 1: run · 5 of 5 slices shown]
[im 1/5]
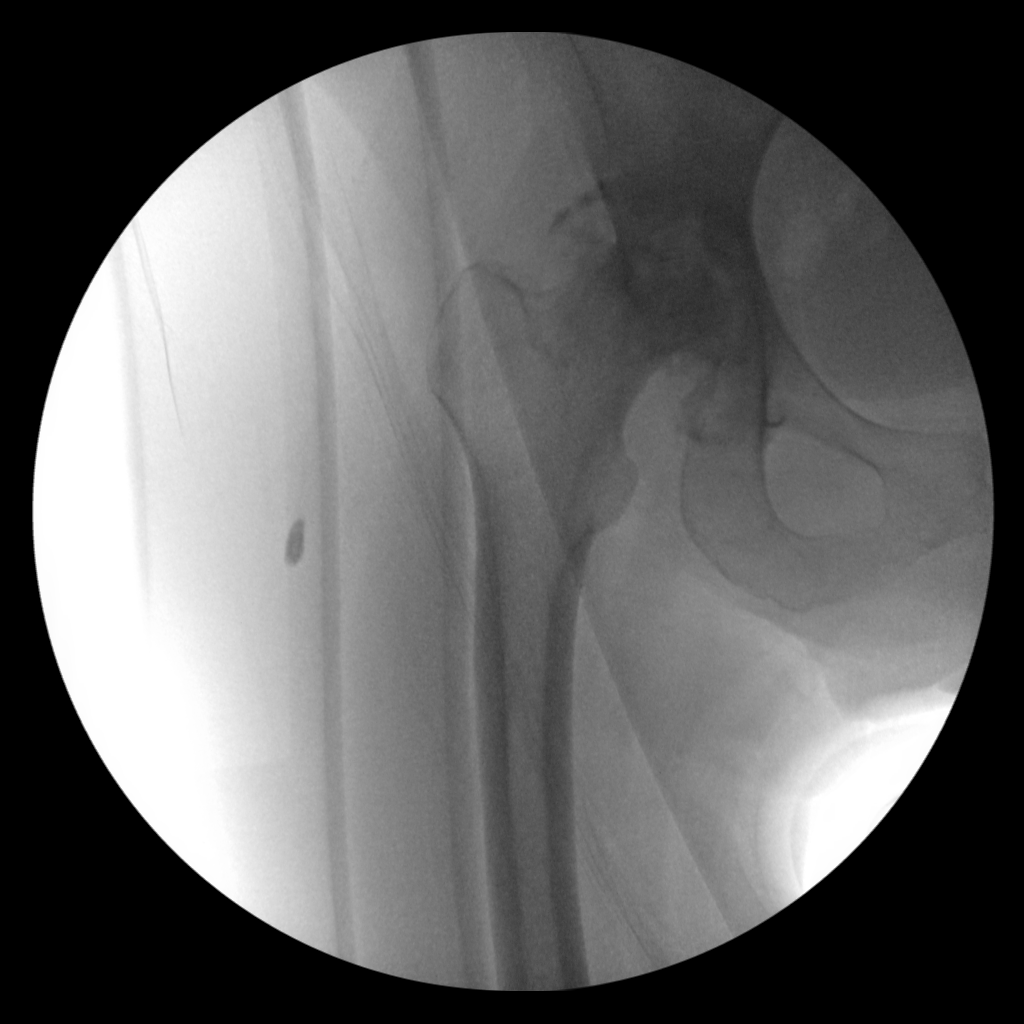
[im 2/5]
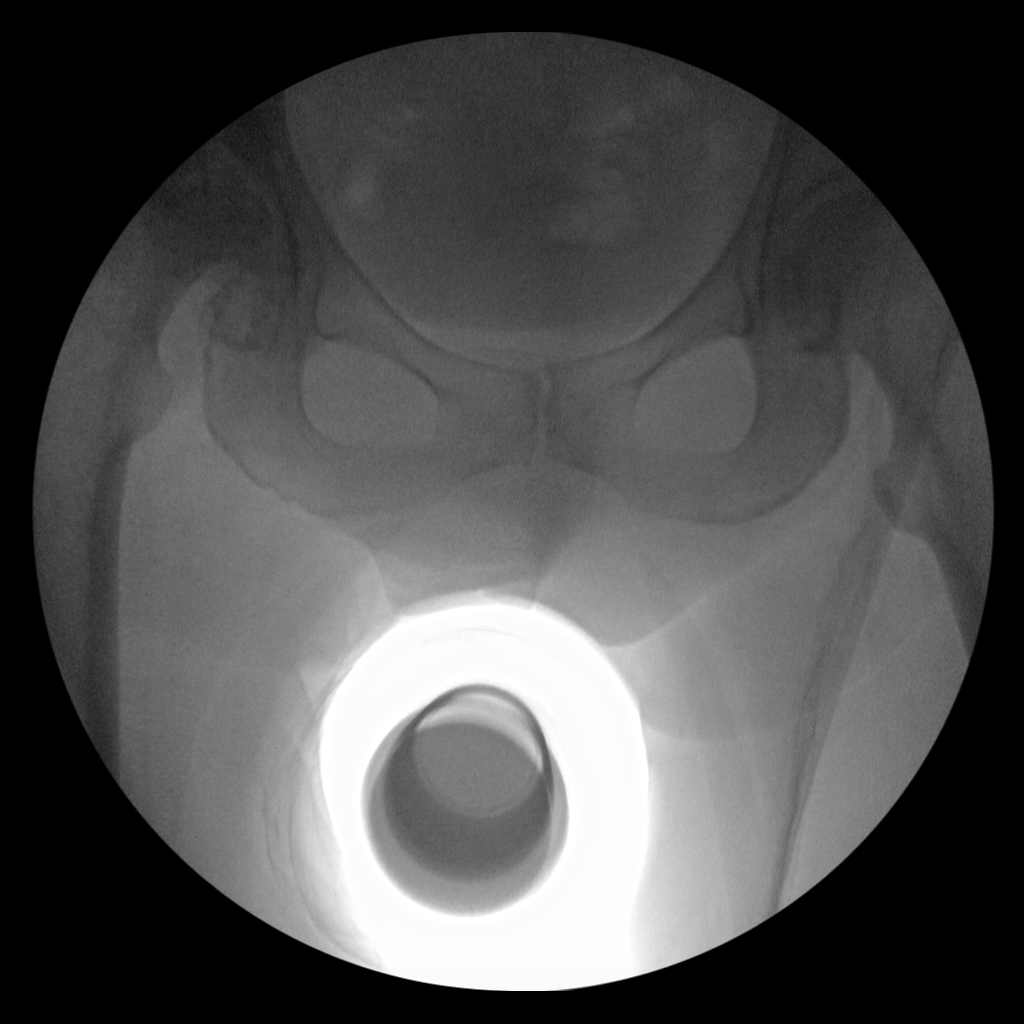
[im 3/5]
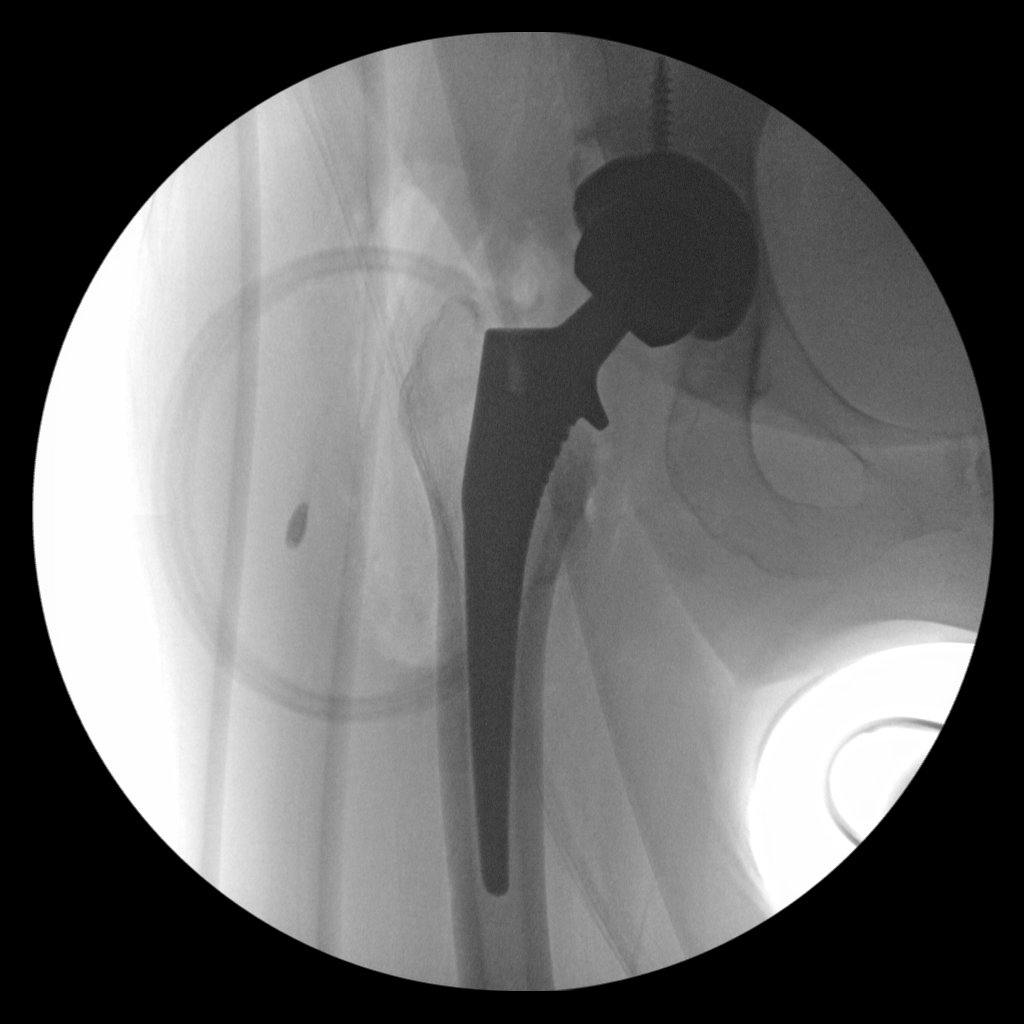
[im 4/5]
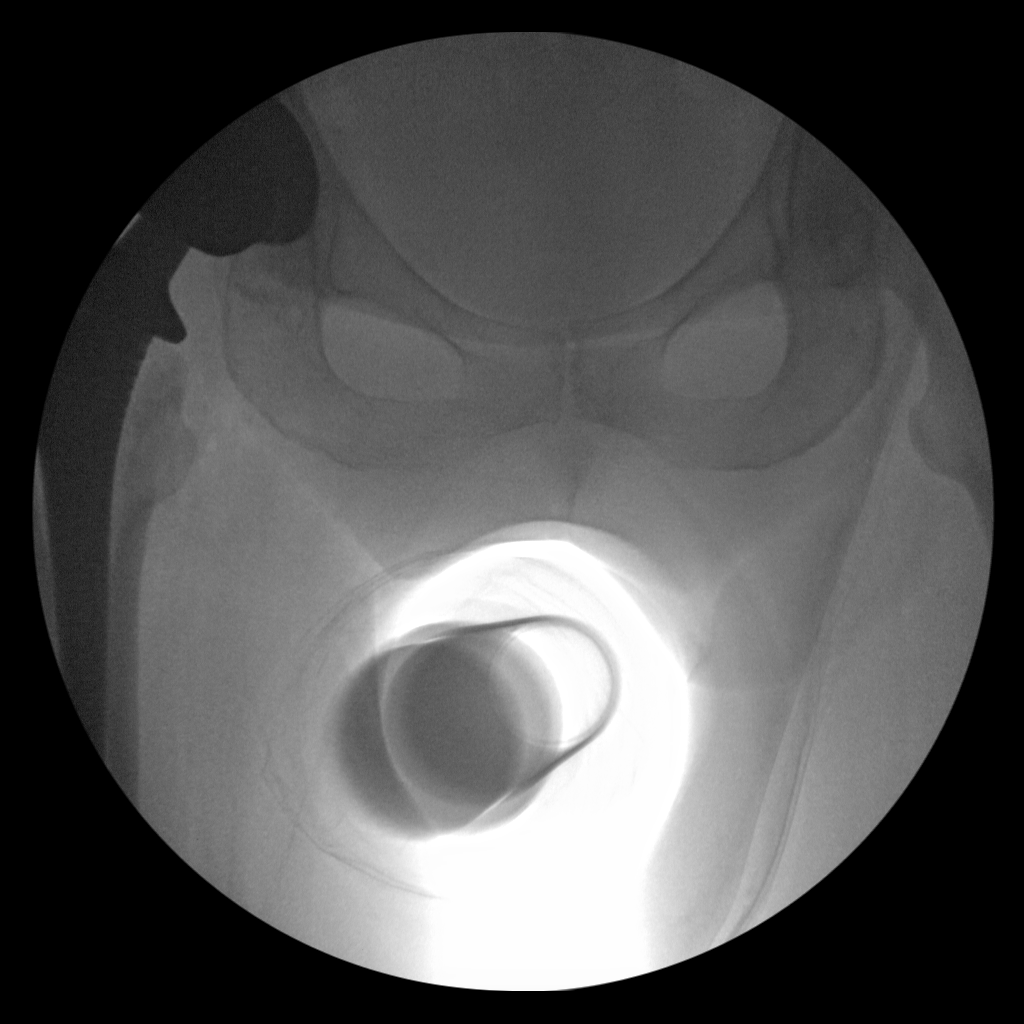
[im 5/5]
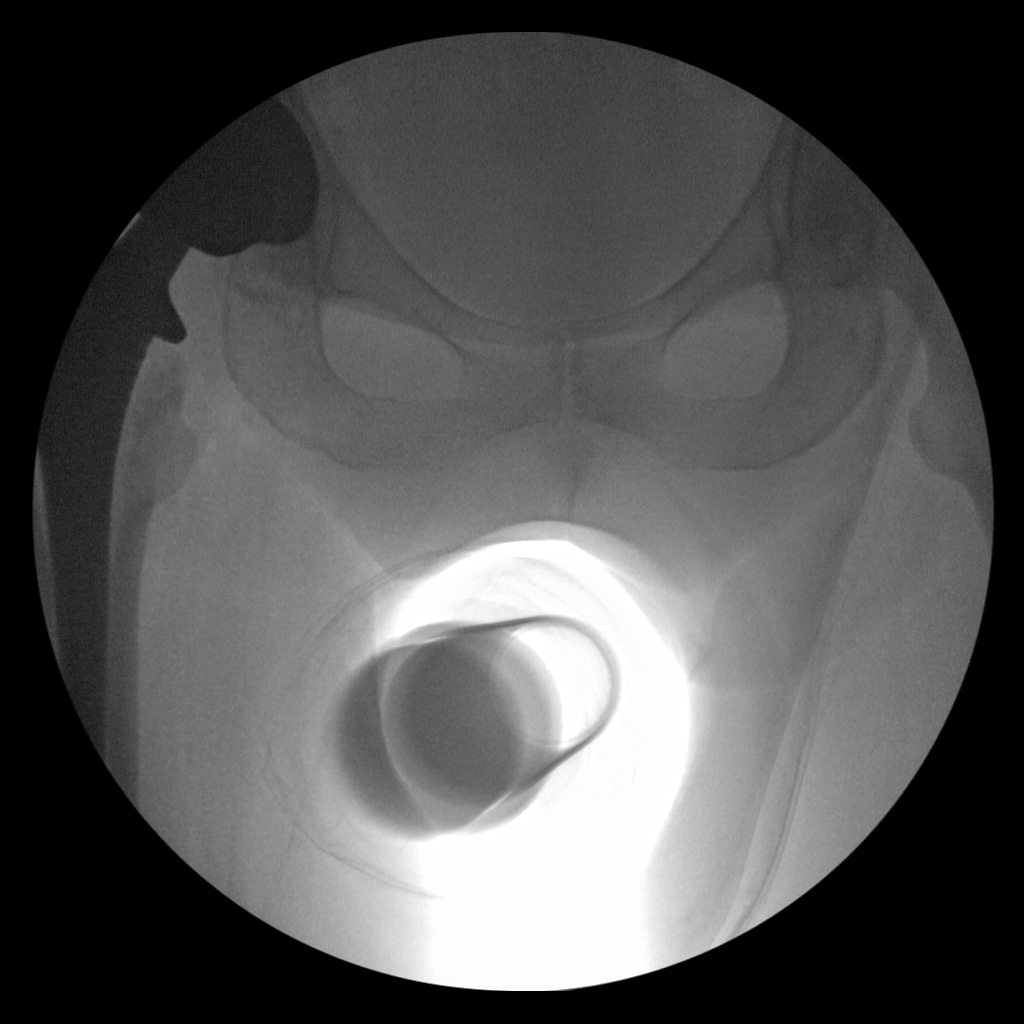

[5 of 5 positions shown; findings below may reference images not displayed]

FINDINGS: Patient status post right hip arthroplasty. Intraoperative
fluoroscopic images submitted for interpretation. Hardware appears
intact. No acute osseous abnormality.
IMPRESSION: Patient status post right hip arthroplasty.
